# Patient Record
Sex: Male | Born: 1960
Health system: Southern US, Community
[De-identification: ages and names within clinical notes are randomized; demographics above are authoritative.]

## PROBLEM LIST (undated history)

## (undated) DIAGNOSIS — J309 Allergic rhinitis, unspecified: Secondary | ICD-10-CM

## (undated) DIAGNOSIS — J45909 Unspecified asthma, uncomplicated: Secondary | ICD-10-CM

## (undated) DIAGNOSIS — K219 Gastro-esophageal reflux disease without esophagitis: Secondary | ICD-10-CM

## (undated) DIAGNOSIS — F4310 Post-traumatic stress disorder, unspecified: Secondary | ICD-10-CM

## (undated) HISTORY — DX: Allergic rhinitis, unspecified: J30.9

## (undated) HISTORY — PX: COLONOSCOPY: SHX174

---

## 2003-01-23 ENCOUNTER — Ambulatory Visit (HOSPITAL_COMMUNITY): Admission: RE | Admit: 2003-01-23 | Discharge: 2003-01-23 | Payer: Self-pay | Admitting: Internal Medicine

## 2003-01-24 ENCOUNTER — Ambulatory Visit (HOSPITAL_COMMUNITY): Admission: RE | Admit: 2003-01-24 | Discharge: 2003-01-24 | Payer: Self-pay | Admitting: Internal Medicine

## 2010-01-16 ENCOUNTER — Emergency Department (HOSPITAL_COMMUNITY): Admission: EM | Admit: 2010-01-16 | Discharge: 2010-01-16 | Payer: Self-pay | Admitting: Emergency Medicine

## 2011-06-26 ENCOUNTER — Ambulatory Visit (INDEPENDENT_AMBULATORY_CARE_PROVIDER_SITE_OTHER): Payer: Federal, State, Local not specified - PPO | Admitting: Physician Assistant

## 2011-06-26 VITALS — BP 131/89 | HR 64 | Temp 97.9°F | Resp 16 | Ht 67.0 in | Wt 222.6 lb

## 2011-06-26 DIAGNOSIS — H9209 Otalgia, unspecified ear: Secondary | ICD-10-CM

## 2011-06-26 DIAGNOSIS — H6690 Otitis media, unspecified, unspecified ear: Secondary | ICD-10-CM

## 2011-06-26 MED ORDER — AMOXICILLIN-POT CLAVULANATE 875-125 MG PO TABS: 1.0000 | ORAL_TABLET | Freq: Two times a day (BID) | ORAL | Status: AC

## 2011-06-26 NOTE — Progress Notes (Signed)
Patient ID: Harry Simmons MRN: [id number], DOB: 10-Sep-1960, 51 y.o. Date of Encounter: 06/26/2011, 7:50 PM  Primary Physician: No primary provider on file.  Chief Complaint:  Chief Complaint  Patient presents with  . Otalgia    Right x 3 dys    HPI: 51 y.o. year old male presents with a 3 day history of right otalgia. Minimal congesiton. Afebrile. No cough. Ear painful and feels full, leading to sensation of muffled hearing. No drainage or discharge from affected ear. Has tried OTC cold preps without success. No GI complaints. Appetite normal. He does have a history of AOM of the right ear several times per year. This feels like his typical presentation. No dizziness or vertigo symptoms.  No sick contacts, recent antibiotics, or recent travels.    No past medical history on file.   Home Meds: Prior to Admission medications   Medication Sig Start Date End Date Taking? Authorizing Provider  albuterol (PROVENTIL HFA;VENTOLIN HFA) 108 (90 BASE) MCG/ACT inhaler Inhale 2 puffs into the lungs every 6 (six) hours as needed.   Yes Historical Provider, MD         budesonide-formoterol (SYMBICORT) 160-4.5 MCG/ACT inhaler Inhale 2 puffs into the lungs 2 (two) times daily.   Yes Historical Provider, MD  loratadine (CLARITIN) 10 MG tablet Take 10 mg by mouth daily.   Yes Historical Provider, MD  omeprazole (PRILOSEC) 20 MG capsule Take 20 mg by mouth daily.   Yes Historical Provider, MD    Allergies:  Allergies  Allergen Reactions  . Codeine Hives and Rash    History   Social History  . Marital Status: Single    Spouse Name: N/A    Number of Children: N/A  . Years of Education: N/A   Occupational History  . Not on file.   Social History Main Topics  . Smoking status: Never Smoker   . Smokeless tobacco: Not on file  . Alcohol Use: Yes     occasionally  . Drug Use: No  . Sexually Active: Yes   Other Topics Concern  . Not on file   Social History Narrative  . No  narrative on file     Review of Systems: Constitutional: negative for chills, fever, night sweats or weight changes HEENT: see above Respiratory: negative for hemoptysis, wheezing, or shortness of breath Abdominal: negative for abdominal pain, nausea, vomiting or diarrhea Dermatological: negative for rash Neurologic: negative for headache   Physical Exam: Blood pressure 131/89, pulse 64, temperature 97.9 F (36.6 C), temperature source Oral, resp. rate 16, height 5\' 7"  (1.702 m), weight 222 lb 9.6 oz (100.971 kg)., Body mass index is 34.86 kg/(m^2). General: Well developed, well nourished, in no acute distress. Head: Normocephalic, atraumatic, eyes without discharge, sclera non-icteric, nares are congested. Bilateral auditory canals clear. Right TM erythematous, dull, and bulging with purulent effusion behind. No perforation visualized. Contralateral TM pearly grey with reflective cone of light, no effusion or perforation visualized. Oral cavity moist, dentition normal. Posterior pharynx with post nasal drip and mild erythema. No peritonsillar abscess or tonsillar exudate. Neck: Supple. No thyromegaly. Full ROM. No lymphadenopathy. Lungs: Clear bilaterally to auscultation without wheezes, rales, or rhonchi. Breathing is unlabored.  Heart: RRR with S1 S2. No murmurs, rubs, or gallops appreciated. Msk:  Strength and tone normal for age. Extremities: No clubbing or cyanosis. No edema. Neuro: Alert and oriented X 3. Moves all extremities spontaneously. CNII-XII grossly in tact. Psych:  Responds to questions appropriately with a normal  affect.     ASSESSMENT AND PLAN:  51 y.o. year old male with acute otitis media of right ear without perforation. -Augmentin 875/125 mg #20 1 po bid no RF -Mucinex  -Tylenol prn -Rest/fluids -RTC precautions -RTC 3 days if no improvement  Signed, Eula Listen, PA-C 06/26/2011 7:50 PM

## 2011-11-17 ENCOUNTER — Emergency Department (HOSPITAL_COMMUNITY)
Admission: EM | Admit: 2011-11-17 | Discharge: 2011-11-17 | Disposition: A | Discharge status: Home / Self Care | Payer: Federal, State, Local not specified - PPO | Attending: Emergency Medicine | Admitting: Emergency Medicine

## 2011-11-17 ENCOUNTER — Emergency Department (HOSPITAL_COMMUNITY): Payer: Federal, State, Local not specified - PPO

## 2011-11-17 ENCOUNTER — Encounter (HOSPITAL_COMMUNITY): Payer: Self-pay | Admitting: Family Medicine

## 2011-11-17 DIAGNOSIS — Z885 Allergy status to narcotic agent status: Secondary | ICD-10-CM | POA: Insufficient documentation

## 2011-11-17 DIAGNOSIS — M549 Dorsalgia, unspecified: Secondary | ICD-10-CM | POA: Insufficient documentation

## 2011-11-17 DIAGNOSIS — K625 Hemorrhage of anus and rectum: Secondary | ICD-10-CM | POA: Insufficient documentation

## 2011-11-17 DIAGNOSIS — F4310 Post-traumatic stress disorder, unspecified: Secondary | ICD-10-CM | POA: Insufficient documentation

## 2011-11-17 DIAGNOSIS — R109 Unspecified abdominal pain: Secondary | ICD-10-CM | POA: Insufficient documentation

## 2011-11-17 DIAGNOSIS — J45909 Unspecified asthma, uncomplicated: Secondary | ICD-10-CM | POA: Insufficient documentation

## 2011-11-17 DIAGNOSIS — Z91013 Allergy to seafood: Secondary | ICD-10-CM | POA: Insufficient documentation

## 2011-11-17 DIAGNOSIS — G8929 Other chronic pain: Secondary | ICD-10-CM | POA: Insufficient documentation

## 2011-11-17 HISTORY — DX: Post-traumatic stress disorder, unspecified: F43.10

## 2011-11-17 HISTORY — DX: Unspecified asthma, uncomplicated: J45.909

## 2011-11-17 LAB — CBC WITH DIFFERENTIAL/PLATELET
Basophils Absolute: 0 10*3/uL (ref 0.0–0.1)
Basophils Relative: 0 % (ref 0–1)
Eosinophils Absolute: 0.1 10*3/uL (ref 0.0–0.7)
Eosinophils Relative: 3 % (ref 0–5)
MCH: 27.2 pg (ref 26.0–34.0)
MCV: 81.7 fL (ref 78.0–100.0)
Platelets: 258 10*3/uL (ref 150–400)
RDW: 14.7 % (ref 11.5–15.5)

## 2011-11-17 LAB — COMPREHENSIVE METABOLIC PANEL
ALT: 25 U/L (ref 0–53)
AST: 15 U/L (ref 0–37)
Calcium: 9.6 mg/dL (ref 8.4–10.5)
GFR calc Af Amer: >90 mL/min (ref >90)
Sodium: 139 mEq/L (ref 135–145)
Total Protein: 7.6 g/dL (ref 6.0–8.3)

## 2011-11-17 LAB — TYPE AND SCREEN

## 2011-11-17 LAB — ABO/RH: ABO/RH(D): B POS

## 2011-11-17 NOTE — ED Notes (Signed)
Pt states his back pain is from his lower back injury previously and his abd pain may be from his acid reflux.

## 2011-11-17 NOTE — ED Notes (Signed)
Per pt 3 weeks of bright red bleeding  From his rectum. sts some lower abdominal pain. sts happens about 3 days a week.

## 2011-11-17 NOTE — ED Notes (Signed)
Pt states that he has had 2 colonoscopies and 1 endoscopy within the last 5 years at the [place] with no abnormal results.

## 2011-11-17 NOTE — ED Provider Notes (Signed)
History     CSN: [id number]  Arrival date & time 11/17/11  1320   First MD Initiated Contact with Patient 11/17/11 1731      Chief Complaint  Patient presents with  . Rectal Bleeding    (Consider location/radiation/quality/duration/timing/severity/associated sxs/prior treatment) The history is provided by the patient.   patient is a 51 year old male veteran followed at the [place] in [facility]. Presents today with three-week history of of rectal bleeding on and off he is actually had this for months to years and has had several extensive workups at the [place] to include at least 3 colonoscopies without finding any etiology some question that there are hemorrhoids that may be related to that but they have improved. Also associated with lower abdominal pain is also chronic. Patient states that the rectal bleeding for a little increased over the last few days and that's why he's concerned that maybe his blood counts would be low. Abdominal pain is in the lower part of his abdomen it's about 5/10 that's typical for him nothing new or worse about that. The bowel movements with blood was only one today and he had 2 yesterday they do stay in the tall order read as also had some bowel movements without any blood. Patient also has a history of chronic back pain known to have a lumbar pars defect he would like to have that read x-ray because that's been getting worse for the last few days as well. No entry. The back pain is also described as a 2-3/10 nonradiating is in the lumbar area. No neuro deficits distally to his lower extremities.  Past Medical History  Diagnosis Date  . Asthma   . PTSD (post-traumatic stress disorder)     History reviewed. No pertinent past surgical history.  History reviewed. No pertinent family history.  History  Substance Use Topics  . Smoking status: Never Smoker   . Smokeless tobacco: Not on file  . Alcohol Use: Yes     occasionally      Review of  Systems  Constitutional: Negative for fever.  HENT: Negative for neck pain.   Respiratory: Negative for shortness of breath.   Cardiovascular: Negative for chest pain.  Gastrointestinal: Positive for abdominal pain and blood in stool. Negative for nausea, vomiting and diarrhea.  Genitourinary: Negative for dysuria.  Musculoskeletal: Positive for back pain.  Skin: Negative for rash.  Neurological: Negative for dizziness, weakness, light-headedness and headaches.  Hematological: Does not bruise/bleed easily.    Allergies  Codeine and Lobster  Home Medications   Current Outpatient Rx  Name Route Sig Dispense Refill  . ALBUTEROL SULFATE HFA 108 (90 BASE) MCG/ACT IN AERS Inhalation Inhale 2 puffs into the lungs every 6 (six) hours as needed.    . BUDESONIDE-FORMOTEROL FUMARATE 160-4.5 MCG/ACT IN AERO Inhalation Inhale 2 puffs into the lungs 2 (two) times daily.    Marland Kitchen LORATADINE 10 MG PO TABS Oral Take 10 mg by mouth daily.    Marland Kitchen MIRTAZAPINE 15 MG PO TABS Oral Take 15 mg by mouth at bedtime.    . OMEPRAZOLE 20 MG PO CPDR Oral Take 20 mg by mouth daily.    . SERTRALINE HCL 100 MG PO TABS Oral Take 100 mg by mouth daily.    . TRAMADOL HCL 50 MG PO TABS Oral Take 50 mg by mouth every 6 (six) hours as needed.      BP 128/43  Pulse 50  Temp 98.2 F (36.8 C) (Oral)  Resp 16  SpO2 99%  Physical Exam  Nursing note and vitals reviewed. Constitutional: He is oriented to person, place, and time. He appears well-developed and well-nourished. No distress.  HENT:  Head: Normocephalic and atraumatic.  Mouth/Throat: Oropharynx is clear and moist.  Eyes: Conjunctivae and EOM are normal. Pupils are equal, round, and reactive to light.  Neck: Normal range of motion. Neck supple.  Cardiovascular: Normal rate, regular rhythm and normal heart sounds.   No murmur heard. Pulmonary/Chest: Effort normal and breath sounds normal.  Abdominal: Soft. Bowel sounds are normal. There is no tenderness.    Musculoskeletal: Normal range of motion. He exhibits no edema and no tenderness.  Neurological: He is alert and oriented to person, place, and time. No cranial nerve deficit. He exhibits normal muscle tone. Coordination normal.  Skin: Skin is warm. No rash noted.    ED Course  Procedures (including critical care time)  Labs Reviewed  CBC WITH DIFFERENTIAL - Abnormal; Notable for the following:    Neutrophils Relative 41 (*)     Lymphocytes Relative 50 (*)     All other components within normal limits  COMPREHENSIVE METABOLIC PANEL - Abnormal; Notable for the following:    Glucose, Bld 124 (*)     GFR calc non Af Amer 85 (*)     All other components within normal limits  TYPE AND SCREEN  ABO/RH   Dg Lumbar Spine Complete  11/17/2011  *RADIOLOGY REPORT*  Clinical Data: Low back pain, no injury  LUMBAR SPINE - COMPLETE 4+ VIEW  Comparison: None.  Findings: The lumbar vertebrae are straightened in alignment. Intervertebral disc spaces appear normal.  There are bilateral pars defects at L5 but no anterolisthesis is seen.  The SI joints appear corticated.  IMPRESSION: Straightened alignment.  Bilateral pars defects at L5.   Original Report Authenticated By: Juline Patch, M.D.    Results for orders placed during the hospital encounter of 11/17/11  CBC WITH DIFFERENTIAL      Component Value Range   WBC 4.5  4.0 - 10.5 K/uL   RBC 5.15  4.22 - 5.81 MIL/uL   Hemoglobin 14.0  13.0 - 17.0 g/dL   HCT 16.1  09.6 - 04.5 %   MCV 81.7  78.0 - 100.0 fL   MCH 27.2  26.0 - 34.0 pg   MCHC 33.3  30.0 - 36.0 g/dL   RDW 40.9  81.1 - 91.4 %   Platelets 258  150 - 400 K/uL   Neutrophils Relative 41 (*) 43 - 77 %   Neutro Abs 1.8  1.7 - 7.7 K/uL   Lymphocytes Relative 50 (*) 12 - 46 %   Lymphs Abs 2.2  0.7 - 4.0 K/uL   Monocytes Relative 6  3 - 12 %   Monocytes Absolute 0.3  0.1 - 1.0 K/uL   Eosinophils Relative 3  0 - 5 %   Eosinophils Absolute 0.1  0.0 - 0.7 K/uL   Basophils Relative 0  0 - 1 %    Basophils Absolute 0.0  0.0 - 0.1 K/uL  COMPREHENSIVE METABOLIC PANEL      Component Value Range   Sodium 139  135 - 145 mEq/L   Potassium 3.8  3.5 - 5.1 mEq/L   Chloride 103  96 - 112 mEq/L   CO2 27  19 - 32 mEq/L   Glucose, Bld 124 (*) 70 - 99 mg/dL   BUN 14  6 - 23 mg/dL   Creatinine, Ser 7.82  0.50 -  1.35 mg/dL   Calcium 9.6  8.4 - 40.9 mg/dL   Total Protein 7.6  6.0 - 8.3 g/dL   Albumin 3.6  3.5 - 5.2 g/dL   AST 15  0 - 37 U/L   ALT 25  0 - 53 U/L   Alkaline Phosphatase 91  39 - 117 U/L   Total Bilirubin 0.3  0.3 - 1.2 mg/dL   GFR calc non Af Amer 85 (*) >90 mL/min   GFR calc Af Amer >90  >90 mL/min  TYPE AND SCREEN      Component Value Range   ABO/RH(D) B POS     Antibody Screen NEG     Sample Expiration 11/20/2011    ABO/RH      Component Value Range   ABO/RH(D) B POS       1. Rectal bleeding   2. Chronic abdominal pain   3. Chronic back pain       MDM  Patient with history of rectal bleeding on and off for several months his had multiple colonoscopies trying to figure this out. Etiology is not known. Here of late patient's had some increased bleeding. Vital signs are normal hemoglobin and hematocrit are normal no significant evidence of blood loss. Patient also has a history of chronic abdominal pain and chronic back pain x-rays at the back today without any acute changes he does have a pars defect according to the patient has been present in the past. Patient is normally followed by the [place] in [facility].  Patient is stable to be discharged home. He will followup with the [place]. He also has a option of returning here.        Shelda Jakes, MD 11/17/11 2132

## 2012-05-16 ENCOUNTER — Encounter (HOSPITAL_COMMUNITY): Payer: Self-pay | Admitting: Emergency Medicine

## 2012-05-16 ENCOUNTER — Inpatient Hospital Stay (HOSPITAL_COMMUNITY)
Admission: EM | Admit: 2012-05-16 | Discharge: 2012-05-21 | DRG: 494 | Disposition: A | Discharge status: Home / Self Care | Payer: Federal, State, Local not specified - PPO | Attending: Internal Medicine | Admitting: Internal Medicine

## 2012-05-16 DIAGNOSIS — R1013 Epigastric pain: Secondary | ICD-10-CM

## 2012-05-16 DIAGNOSIS — K8062 Calculus of gallbladder and bile duct with acute cholecystitis without obstruction: Principal | ICD-10-CM | POA: Diagnosis present

## 2012-05-16 DIAGNOSIS — J45909 Unspecified asthma, uncomplicated: Secondary | ICD-10-CM | POA: Diagnosis present

## 2012-05-16 DIAGNOSIS — K8042 Calculus of bile duct with acute cholecystitis without obstruction: Secondary | ICD-10-CM | POA: Diagnosis present

## 2012-05-16 DIAGNOSIS — R7989 Other specified abnormal findings of blood chemistry: Secondary | ICD-10-CM | POA: Diagnosis present

## 2012-05-16 DIAGNOSIS — K219 Gastro-esophageal reflux disease without esophagitis: Secondary | ICD-10-CM | POA: Diagnosis present

## 2012-05-16 DIAGNOSIS — K8050 Calculus of bile duct without cholangitis or cholecystitis without obstruction: Secondary | ICD-10-CM

## 2012-05-16 DIAGNOSIS — R945 Abnormal results of liver function studies: Secondary | ICD-10-CM | POA: Diagnosis present

## 2012-05-16 HISTORY — DX: Gastro-esophageal reflux disease without esophagitis: K21.9

## 2012-05-16 MED ORDER — HYDROMORPHONE HCL PF 1 MG/ML IJ SOLN
1.0000 mg | Freq: Once | INTRAMUSCULAR | Status: AC
  Administered 2012-05-16: 1 mg via INTRAVENOUS
  Filled 2012-05-16: qty 1

## 2012-05-16 MED ORDER — SODIUM CHLORIDE 0.9 % IV BOLUS (SEPSIS)
1000.0000 mL | Freq: Once | INTRAVENOUS | Status: AC
  Administered 2012-05-17: 1000 mL via INTRAVENOUS

## 2012-05-16 MED ORDER — ONDANSETRON HCL 4 MG/2ML IJ SOLN
4.0000 mg | Freq: Once | INTRAMUSCULAR | Status: AC
  Administered 2012-05-16: 4 mg via INTRAVENOUS
  Filled 2012-05-16: qty 2

## 2012-05-16 NOTE — ED Provider Notes (Signed)
History     CSN: [id number]  Arrival date & time 05/16/12  1901   First MD Initiated Contact with Patient 05/16/12 2146      Chief Complaint  Patient presents with  . Abdominal Pain    (Consider location/radiation/quality/duration/timing/severity/associated sxs/prior treatment) HPI Comments: Patient is a 52 year old male with no significant past medical history who presents with abdominal pain that started last night. The pain is located in his generalized abdomen and does not radiate. The pain is described as aching and severe. The pain started gradually and progressively worsened since the onset. No alleviating/aggravating factors. The patient has tried nothing for symptoms without relief. Associated symptoms include nausea and vomiting. Patient denies fever, headache, diarrhea, chest pain, SOB, dysuria, constipation.     Past Medical History  Diagnosis Date  . Asthma   . PTSD (post-traumatic stress disorder)     Past Surgical History  Procedure Laterality Date  . Colonoscopy      Family History  Problem Relation Age of Onset  . Diabetes Mother   . Hypotension Mother   . Hypertension Father   . CAD Father   . Diabetes Sister   . CAD Sister   . Hypertension Sister   . Diabetes Brother     History  Substance Use Topics  . Smoking status: Never Smoker   . Smokeless tobacco: Not on file  . Alcohol Use: No      Review of Systems  Gastrointestinal: Positive for nausea, vomiting and abdominal pain.  All other systems reviewed and are negative.    Allergies  Codeine and Lobster  Home Medications   Current Outpatient Rx  Name  Route  Sig  Dispense  Refill  . budesonide-formoterol (SYMBICORT) 160-4.5 MCG/ACT inhaler   Inhalation   Inhale 2 puffs into the lungs 2 (two) times daily.         . mirtazapine (REMERON) 15 MG tablet   Oral   Take 15 mg by mouth at bedtime as needed. For sleep         . omeprazole (PRILOSEC) 20 MG capsule   Oral   Take 20  mg by mouth daily.         . sertraline (ZOLOFT) 100 MG tablet   Oral   Take 100 mg by mouth daily.         . traMADol (ULTRAM) 50 MG tablet   Oral   Take 50 mg by mouth every 6 (six) hours as needed for pain.          Marland Kitchen albuterol (PROVENTIL HFA;VENTOLIN HFA) 108 (90 BASE) MCG/ACT inhaler   Inhalation   Inhale 2 puffs into the lungs every 6 (six) hours as needed.         . loratadine (CLARITIN) 10 MG tablet   Oral   Take 10 mg by mouth daily as needed. For allergies           BP 135/90  Pulse 65  Temp(Src) 98.1 F (36.7 C) (Oral)  Resp 16  Ht 5\' 7"  (1.702 m)  Wt 215 lb (97.523 kg)  BMI 33.67 kg/m2  SpO2 97%  Physical Exam  Nursing note and vitals reviewed. Constitutional: He is oriented to person, place, and time. He appears well-developed and well-nourished. No distress.  HENT:  Head: Normocephalic and atraumatic.  Eyes: Conjunctivae and EOM are normal. Pupils are equal, round, and reactive to light. No scleral icterus.  Neck: Normal range of motion. Neck supple.  Cardiovascular: Normal rate  and regular rhythm.  Exam reveals no gallop and no friction rub.   No murmur heard. Pulmonary/Chest: Effort normal and breath sounds normal. He has no wheezes. He has no rales. He exhibits no tenderness.  Abdominal: Soft. He exhibits no distension. There is tenderness. There is no rebound and no guarding.  Generalized tenderness to palpation. No peritoneal signs.   Musculoskeletal: Normal range of motion.  Neurological: He is alert and oriented to person, place, and time. Coordination normal.  Speech is goal-oriented. Moves limbs without ataxia.   Skin: Skin is warm and dry.  Psychiatric: He has a normal mood and affect. His behavior is normal.    ED Course  Procedures (including critical care time)  Labs Reviewed  COMPREHENSIVE METABOLIC PANEL - Abnormal; Notable for the following:    Glucose, Bld 109 (*)    Total Protein 8.6 (*)    AST 487 (*)    ALT 833 (*)     Alkaline Phosphatase 160 (*)    Total Bilirubin 1.9 (*)    All other components within normal limits  URINALYSIS, ROUTINE W REFLEX MICROSCOPIC - Abnormal; Notable for the following:    Color, Urine AMBER (*)    Specific Gravity, Urine 1.031 (*)    Bilirubin Urine MODERATE (*)    Urobilinogen, UA 2.0 (*)    Leukocytes, UA TRACE (*)    All other components within normal limits  URINE MICROSCOPIC-ADD ON - Abnormal; Notable for the following:    Crystals CA OXALATE CRYSTALS (*)    All other components within normal limits  COMPREHENSIVE METABOLIC PANEL - Abnormal; Notable for the following:    Glucose, Bld 105 (*)    Albumin 3.3 (*)    AST 237 (*)    ALT 608 (*)    Alkaline Phosphatase 165 (*)    Total Bilirubin 3.2 (*)    All other components within normal limits  COMPREHENSIVE METABOLIC PANEL - Abnormal; Notable for the following:    Albumin 3.2 (*)    AST 128 (*)    ALT 455 (*)    Alkaline Phosphatase 172 (*)    Total Bilirubin 4.0 (*)    GFR calc non Af Amer 86 (*)    All other components within normal limits  COMPREHENSIVE METABOLIC PANEL - Abnormal; Notable for the following:    Albumin 3.0 (*)    AST 58 (*)    ALT 305 (*)    Alkaline Phosphatase 166 (*)    Total Bilirubin 1.4 (*)    GFR calc non Af Amer 85 (*)    All other components within normal limits  CBC WITH DIFFERENTIAL  LIPASE, BLOOD  MAGNESIUM  PHOSPHORUS  CBC WITH DIFFERENTIAL  APTT  PROTIME-INR  TSH  CBC   Nm Hepatobiliary  05/17/2012  *RADIOLOGY REPORT*  Clinical Data:  Elevated bilirubin.  NUCLEAR MEDICINE HEPATOBILIARY IMAGING  Technique:  Sequential images of the abdomen were obtained out to 60 minutes following intravenous administration of radiopharmaceutical.  Radiopharmaceutical:  5.67mCi Tc-20m Choletec  Comparison:  I have CT 05/17/2012,  Findings: There is prompt extraction of radiotracer from the blood pool and homogeneous uptake within the liver.  There are no counts excreted into the  common bile duct over the 95 minutes of imaging. No activity within the bowel.  The gallbladder is not filled.  The biliary tree is not identified readily.  IMPRESSION:  1.  No counts excreted into the biliary tree.  Legrand Rams this to represent  obstruction of the  common bile duct or common hepatic duct.  Less likely differential would include acute hepatitis. 2.  Nonvisualization the gallbladder is likely secondary to obstruction of the common hepatic or  common bile duct.  Cannot evaluate patency of the cystic duct with this pattern.  Findings discussed Dr. Debroah Loop  on 05/18/2038 at 1600 hours   Original Report Authenticated By: Genevive Bi, M.D.    Dg C-arm 61-120 Min-no Report  05/18/2012  CLINICAL DATA: CBD Stones   C-ARM 61-120 MINUTES  Fluoroscopy was utilized by the requesting physician.  No radiographic  interpretation.       1. Choledocholithiasis   2. Abdominal pain   3. Cholelithiases   4. Abnormal liver function tests   5. Abdominal pain, epigastric       MDM  10:26 PM Labs pending. Patient will have fluids, zofran and morphine for symptoms. Vitals stable and patient afebrile.   Patient has elevated liver enzymes and dilated CBD. Patient signed out to Dr. Rulon Abide for disposition.       Emilia Beck, PA-C 05/19/12 [phone]

## 2012-05-16 NOTE — ED Notes (Signed)
Pt states he has pain in his abdomen that started last night in his lower abdomen and have worked upward  Pt states the pain made it hard to breathe and then he started to have vomiting  Pt states he went to the [place] and waited for 3 hrs and had not been seen so he came here  Pt is in no acute distress in triage  Pt states vomiting has not helped with the pain

## 2012-05-17 ENCOUNTER — Encounter (HOSPITAL_COMMUNITY): Payer: Self-pay | Admitting: Internal Medicine

## 2012-05-17 ENCOUNTER — Inpatient Hospital Stay (HOSPITAL_COMMUNITY): Payer: Federal, State, Local not specified - PPO

## 2012-05-17 ENCOUNTER — Emergency Department (HOSPITAL_COMMUNITY): Payer: Federal, State, Local not specified - PPO

## 2012-05-17 DIAGNOSIS — K8010 Calculus of gallbladder with chronic cholecystitis without obstruction: Secondary | ICD-10-CM

## 2012-05-17 DIAGNOSIS — K824 Cholesterolosis of gallbladder: Secondary | ICD-10-CM

## 2012-05-17 LAB — CBC WITH DIFFERENTIAL/PLATELET
Basophils Absolute: 0 10*3/uL (ref 0.0–0.1)
Basophils Relative: 0 % (ref 0–1)
Basophils Relative: 0 % (ref 0–1)
Eosinophils Absolute: 0.2 10*3/uL (ref 0.0–0.7)
Hemoglobin: 15.1 g/dL (ref 13.0–17.0)
Hemoglobin: 13.5 g/dL (ref 13.0–17.0)
MCH: 27.1 pg (ref 26.0–34.0)
MCHC: 33.6 g/dL (ref 30.0–36.0)
MCHC: 32.9 g/dL (ref 30.0–36.0)
Monocytes Relative: 7 % (ref 3–12)
Neutro Abs: 3.6 10*3/uL (ref 1.7–7.7)
Neutro Abs: 2 10*3/uL (ref 1.7–7.7)
Neutrophils Relative %: 64 % (ref 43–77)
Neutrophils Relative %: 45 % (ref 43–77)
Platelets: 251 10*3/uL (ref 150–400)
RBC: 4.98 MIL/uL (ref 4.22–5.81)

## 2012-05-17 LAB — COMPREHENSIVE METABOLIC PANEL
ALT: 833 U/L — ABNORMAL HIGH (ref 0–53)
ALT: 608 U/L — ABNORMAL HIGH (ref 0–53)
AST: 487 U/L — ABNORMAL HIGH (ref 0–37)
AST: 237 U/L — ABNORMAL HIGH (ref 0–37)
Albumin: 3.9 g/dL (ref 3.5–5.2)
Albumin: 3.3 g/dL — ABNORMAL LOW (ref 3.5–5.2)
Alkaline Phosphatase: 160 U/L — ABNORMAL HIGH (ref 39–117)
Alkaline Phosphatase: 165 U/L — ABNORMAL HIGH (ref 39–117)
BUN: 14 mg/dL (ref 6–23)
CO2: 26 mEq/L (ref 19–32)
Chloride: 102 mEq/L (ref 96–112)
Chloride: 107 mEq/L (ref 96–112)
GFR calc non Af Amer: >90 mL/min (ref >90)
Potassium: 4.1 mEq/L (ref 3.5–5.1)
Potassium: 4.8 mEq/L (ref 3.5–5.1)
Sodium: 140 mEq/L (ref 135–145)
Sodium: 142 mEq/L (ref 135–145)
Total Bilirubin: 1.9 mg/dL — ABNORMAL HIGH (ref 0.3–1.2)
Total Bilirubin: 3.2 mg/dL — ABNORMAL HIGH (ref 0.3–1.2)

## 2012-05-17 LAB — URINALYSIS, ROUTINE W REFLEX MICROSCOPIC
Glucose, UA: NEGATIVE mg/dL
Hgb urine dipstick: NEGATIVE
Ketones, ur: NEGATIVE mg/dL
pH: 6.5 (ref 5.0–8.0)

## 2012-05-17 LAB — URINE MICROSCOPIC-ADD ON

## 2012-05-17 LAB — APTT: aPTT: 30 seconds (ref 24–37)

## 2012-05-17 LAB — PROTIME-INR
INR: 1.09 (ref 0.00–1.49)
Prothrombin Time: 14 seconds (ref 11.6–15.2)

## 2012-05-17 LAB — PHOSPHORUS: Phosphorus: 2.4 mg/dL (ref 2.3–4.6)

## 2012-05-17 LAB — MAGNESIUM: Magnesium: 2.1 mg/dL (ref 1.5–2.5)

## 2012-05-17 MED ORDER — ONDANSETRON HCL 4 MG/2ML IJ SOLN
4.0000 mg | Freq: Four times a day (QID) | INTRAMUSCULAR | Status: DC | PRN
  Administered 2012-05-17 – 2012-05-19 (×3): 4 mg via INTRAVENOUS
  Filled 2012-05-17 (×3): qty 2

## 2012-05-17 MED ORDER — LACTATED RINGERS IV SOLN: INTRAVENOUS | Status: AC

## 2012-05-17 MED ORDER — MIRTAZAPINE 15 MG PO TABS
15.0000 mg | ORAL_TABLET | Freq: Every evening | ORAL | Status: DC | PRN
  Filled 2012-05-17: qty 1

## 2012-05-17 MED ORDER — LORATADINE 10 MG PO TABS: 10.0000 mg | ORAL_TABLET | Freq: Every day | ORAL | Status: DC | PRN

## 2012-05-17 MED ORDER — BUDESONIDE-FORMOTEROL FUMARATE 160-4.5 MCG/ACT IN AERO
2.0000 | INHALATION_SPRAY | Freq: Two times a day (BID) | RESPIRATORY_TRACT | Status: DC
  Administered 2012-05-17 – 2012-05-21 (×9): 2 via RESPIRATORY_TRACT
  Filled 2012-05-17: qty 6

## 2012-05-17 MED ORDER — IOHEXOL 300 MG/ML  SOLN
100.0000 mL | Freq: Once | INTRAMUSCULAR | Status: AC | PRN
  Administered 2012-05-17: 100 mL via INTRAVENOUS

## 2012-05-17 MED ORDER — IOHEXOL 300 MG/ML  SOLN
50.0000 mL | Freq: Once | INTRAMUSCULAR | Status: AC | PRN
  Administered 2012-05-17: 50 mL via ORAL

## 2012-05-17 MED ORDER — HYDROMORPHONE HCL PF 1 MG/ML IJ SOLN
1.0000 mg | INTRAMUSCULAR | Status: AC | PRN
  Administered 2012-05-17: 1 mg via INTRAVENOUS
  Filled 2012-05-17: qty 1

## 2012-05-17 MED ORDER — ONDANSETRON HCL 4 MG PO TABS: 4.0000 mg | ORAL_TABLET | Freq: Four times a day (QID) | ORAL | Status: DC | PRN

## 2012-05-17 MED ORDER — PANTOPRAZOLE SODIUM 40 MG PO TBEC
40.0000 mg | DELAYED_RELEASE_TABLET | Freq: Every day | ORAL | Status: DC
  Administered 2012-05-21: 40 mg via ORAL
  Filled 2012-05-17 (×5): qty 1

## 2012-05-17 MED ORDER — ONDANSETRON HCL 4 MG/2ML IJ SOLN: 4.0000 mg | Freq: Three times a day (TID) | INTRAMUSCULAR | Status: DC | PRN

## 2012-05-17 MED ORDER — SERTRALINE HCL 100 MG PO TABS
100.0000 mg | ORAL_TABLET | Freq: Every day | ORAL | Status: DC
  Filled 2012-05-17 (×5): qty 1

## 2012-05-17 MED ORDER — SODIUM CHLORIDE 0.9 % IV SOLN
INTRAVENOUS | Status: DC
  Administered 2012-05-17 – 2012-05-21 (×8): via INTRAVENOUS

## 2012-05-17 MED ORDER — ALBUTEROL SULFATE HFA 108 (90 BASE) MCG/ACT IN AERS
2.0000 | INHALATION_SPRAY | Freq: Four times a day (QID) | RESPIRATORY_TRACT | Status: DC | PRN
  Filled 2012-05-17: qty 6.7

## 2012-05-17 MED ORDER — TECHNETIUM TC 99M MEBROFENIN IV KIT
5.5000 | PACK | Freq: Once | INTRAVENOUS | Status: AC | PRN
  Administered 2012-05-17: 5.5 via INTRAVENOUS

## 2012-05-17 NOTE — H&P (Signed)
[facility] History and Physical  GURTEJ NOYOLA ZOX:[id number] DOB: Oct 28, 1960 DOA: 05/16/2012  Referring physician: ER physician PCP: Aarron Wierzbicki, MD   Chief Complaint: Epigastric pain  HPI:  52 year old pleasant gentleman with past medical history of asthma and depression who presented with worsening epigastric pain for one day prior to the admission associated with nausea and one episode of vomiting. Patient reported no significant symptomatic relief with pain medications given in ED. In ED, evaluation included CMET which revealed elevated liver enzymes at AST/ALT/ALT 237/608/165 respectively and bilirubin of 3.2. In addition, abdominal ultrasound showed findings of cholelithiasis without sonographic evidence of acute cholecystitis. GI was consulted for input on management. Plan for HIDA scan today and EUS with possible ERCP.  Assessment and Plan:  Principal Problem:   *Abdominal pain, epigastric  Secondary to cholelithiasis  Liver enzymes elevated as mentioned above: AST/ALT/ALT 237/608/165 respectively and bilirubin of 3.2  Appreciate GI following. Plan for HIDA scan today and EUS +/- ERCP Active Problems:   Abnormal liver function tests  Secondary to cholelithiasis  Trend LFTs  Code Status: Full Family Communication: Pt at bedside Disposition Plan: Admit for further evaluation  Manson Passey, MD  [facility] Pager [phone]  If 7PM-7AM, please contact night-coverage [email or web address] Password [email or web address] 05/17/2012, 3:13 PM  Review of Systems:  Constitutional: Negative for fever, chills and malaise/fatigue. Negative for diaphoresis.  HENT: Negative for hearing loss, ear pain, nosebleeds, congestion, sore throat, neck pain, tinnitus and ear discharge.   Eyes: Negative for blurred vision, double vision, photophobia, pain, discharge and redness.  Respiratory: Negative for cough, hemoptysis, sputum production, shortness of breath, wheezing and stridor.   Cardiovascular:  Negative for chest pain, palpitations, orthopnea, claudication and leg swelling.  Gastrointestinal: per HPI.  Genitourinary: Negative for dysuria, urgency, frequency, hematuria and flank pain.  Musculoskeletal: Negative for myalgias, back pain, joint pain and falls.  Skin: Negative for itching and rash.  Neurological: Negative for dizziness and weakness. Negative for tingling, tremors, sensory change, speech change, focal weakness, loss of consciousness and headaches.  Endo/Heme/Allergies: Negative for environmental allergies and polydipsia. Does not bruise/bleed easily.  Psychiatric/Behavioral: Negative for suicidal ideas. The patient is not nervous/anxious.      Past Medical History  Diagnosis Date  . Asthma   . PTSD (post-traumatic stress disorder)    Past Surgical History  Procedure Laterality Date  . Colonoscopy     Social History:  reports that he has never smoked. He has never used smokeless tobacco. He reports that he does not drink alcohol or use illicit drugs.  Allergies  Allergen Reactions  . Codeine Hives and Rash    Tolerates Dilaudid.  Chrisandra Netters (Shellfish Allergy) Shortness Of Breath    Patient stated not all shellfish just lobster    Family History:  Family History  Problem Relation Age of Onset  . Diabetes Mother   . Hypotension Mother   . Hypertension Father   . CAD Father   . Diabetes Sister   . CAD Sister   . Hypertension Sister   . Diabetes Brother      Prior to Admission medications   Medication Sig Start Date End Date Taking? Authorizing Jerrod Damiano  budesonide-formoterol (SYMBICORT) 160-4.5 MCG/ACT inhaler Inhale 2 puffs into the lungs 2 (two) times daily.   Yes Historical Cleofas Hudgins, MD  mirtazapine (REMERON) 15 MG tablet Take 15 mg by mouth at bedtime as needed. For sleep   Yes Historical Kierria Feigenbaum, MD  omeprazole (PRILOSEC) 20 MG capsule Take 20 mg by mouth daily.  Yes Historical Leeloo Silverthorne, MD  sertraline (ZOLOFT) 100 MG tablet Take 100 mg by mouth  daily.   Yes Historical Elois Averitt, MD  traMADol (ULTRAM) 50 MG tablet Take 50 mg by mouth every 6 (six) hours as needed for pain.    Yes Historical Torell Minder, MD  albuterol (PROVENTIL HFA;VENTOLIN HFA) 108 (90 BASE) MCG/ACT inhaler Inhale 2 puffs into the lungs every 6 (six) hours as needed.    Historical Melina Mosteller, MD  loratadine (CLARITIN) 10 MG tablet Take 10 mg by mouth daily as needed. For allergies    Historical Agam Tuohy, MD   Physical Exam: Filed Vitals:   05/16/12 2007 05/17/12 0326 05/17/12 0950  BP: 135/90 116/73 133/82  Pulse: 65 57 49  Temp: 98.1 F (36.7 C)  97.9 F (36.6 C)  TempSrc: Oral  Oral  Resp: 16 15 16   Height: 5\' 7"  (1.702 m)    Weight: 97.523 kg (215 lb)    SpO2: 97% 97% 96%    Physical Exam  Constitutional: Appears well-developed and well-nourished. No distress.  HENT: Normocephalic. External right and left ear normal. Oropharynx is clear and moist.  Eyes: Conjunctivae and EOM are normal. PERRLA, no scleral icterus.  Neck: Normal ROM. Neck supple. No JVD. No tracheal deviation. No thyromegaly.  CVS: RRR, S1/S2 +, no murmurs, no gallops, no carotid bruit.  Pulmonary: Effort and breath sounds normal, no stridor, rhonchi, wheezes, rales.  Abdominal: Soft. BS +,  no distension, tenderness, rebound or guarding.  Musculoskeletal: Normal range of motion. No edema and no tenderness.  Lymphadenopathy: No lymphadenopathy noted, cervical, inguinal. Neuro: Alert. Normal reflexes, muscle tone coordination. No cranial nerve deficit. Skin: Skin is warm and dry. No rash noted. Not diaphoretic. No erythema. No pallor.  Psychiatric: Normal mood and affect. Behavior, judgment, thought content normal.   Labs on Admission:  Basic Metabolic Panel:  Recent Labs Lab 05/16/12 2315 05/17/12 0945  NA 140 142  K 4.1 4.8  CL 102 107  CO2 27 26  GLUCOSE 109* 105*  BUN 14 10  CREATININE 0.95 0.93  CALCIUM 9.6 8.9  MG  --  2.1  PHOS  --  2.4   Liver Function Tests:  Recent  Labs Lab 05/16/12 2315 05/17/12 0945  AST 487* 237*  ALT 833* 608*  ALKPHOS 160* 165*  BILITOT 1.9* 3.2*  PROT 8.6* 7.4  ALBUMIN 3.9 3.3*    Recent Labs Lab 05/16/12 2315  LIPASE 34   No results found for this basename: AMMONIA,  in the last 168 hours CBC:  Recent Labs Lab 05/16/12 2315 05/17/12 0945  WBC 5.5 4.5  NEUTROABS 3.6 2.0  HGB 15.1 13.5  HCT 44.9 41.0  MCV 81.6 82.3  PLT 276 251   Cardiac Enzymes: No results found for this basename: CKTOTAL, CKMB, CKMBINDEX, TROPONINI,  in the last 168 hours BNP: No components found with this basename: POCBNP,  CBG: No results found for this basename: GLUCAP,  in the last 168 hours  Radiological Exams on Admission: US Abdomen Complete 05/17/2012  * IMPRESSION: Cholelithiasis without sonographic evidence for acute cholecystitis.  Nonspecific dilatationof of the proximal CBD. The distal CBD is obscured. Correlate with LFTs and ERCP if warranted.  Hepatic steatosis.  Hypoechoic area along the gallbladder fossa may reflect an area of fatty sparing.  This can be reevaluated with a 6- month ultrasound follow-up or non emergent MRI.      EKG: Normal sinus rhythm, no ST/T wave changes  Time spent: 65 minutes

## 2012-05-17 NOTE — Consult Note (Signed)
Reason for Consult: Cholelithiasis and abnormal liver enzymes. Referring Physician: [facility]  Darene Lamer HPI: This is a 52 year old male with a PMH of asthma and a vague history of "inflammation" in his intestines who is admitted for epigastric pain.  The patient reports that his pain was unresponsive to PPIs and it continued to persist.  He had one episode of vomiting yesterday and the states that his symptoms started acutely 2 hours post prandially.  Since his arrival to the hospital his pain has not changed.  An ultrasound was performed for his elevated liver enzymes and cholelithiasis was identified, however, there was no evidence of cholecystitis.  The follow up CT scan confirmed the findings of the ultrasound and there was no evidence of any CBD dilation.  No overt evidence of any stones in the CBD.  As a result of his symptoms and findings a GI consultation was requested.  Past Medical History  Diagnosis Date  . Asthma   . PTSD (post-traumatic stress disorder)     Past Surgical History  Procedure Laterality Date  . Colonoscopy      Family History  Problem Relation Age of Onset  . Diabetes Mother   . Hypotension Mother   . Hypertension Father   . CAD Father   . Diabetes Sister   . CAD Sister   . Hypertension Sister   . Diabetes Brother     Social History:  reports that he has never smoked. He has never used smokeless tobacco. He reports that he does not drink alcohol or use illicit drugs.  Allergies:  Allergies  Allergen Reactions  . Codeine Hives and Rash    Tolerates Dilaudid.  Chrisandra Netters (Shellfish Allergy) Shortness Of Breath    Patient stated not all shellfish just lobster    Medications:  Scheduled: . budesonide-formoterol  2 puff Inhalation BID  . lactated ringers   Intravenous STAT  . pantoprazole  40 mg Oral Daily  . sertraline  100 mg Oral Daily   Continuous: . sodium chloride      Results for orders placed during the hospital encounter  of 05/16/12 (from the past 24 hour(s))  CBC WITH DIFFERENTIAL     Status: None   Collection Time    05/16/12 11:15 PM      Result Value Range   WBC 5.5  4.0 - 10.5 K/uL   RBC 5.50  4.22 - 5.81 MIL/uL   Hemoglobin 15.1  13.0 - 17.0 g/dL   HCT 19.1  47.8 - 29.5 %   MCV 81.6  78.0 - 100.0 fL   MCH 27.5  26.0 - 34.0 pg   MCHC 33.6  30.0 - 36.0 g/dL   RDW 62.1  30.8 - 65.7 %   Platelets 276  150 - 400 K/uL   Neutrophils Relative 64  43 - 77 %   Neutro Abs 3.6  1.7 - 7.7 K/uL   Lymphocytes Relative 27  12 - 46 %   Lymphs Abs 1.5  0.7 - 4.0 K/uL   Monocytes Relative 7  3 - 12 %   Monocytes Absolute 0.4  0.1 - 1.0 K/uL   Eosinophils Relative 1  0 - 5 %   Eosinophils Absolute 0.1  0.0 - 0.7 K/uL   Basophils Relative 0  0 - 1 %   Basophils Absolute 0.0  0.0 - 0.1 K/uL  COMPREHENSIVE METABOLIC PANEL     Status: Abnormal   Collection Time    05/16/12 11:15  PM      Result Value Range   Sodium 140  135 - 145 mEq/L   Potassium 4.1  3.5 - 5.1 mEq/L   Chloride 102  96 - 112 mEq/L   CO2 27  19 - 32 mEq/L   Glucose, Bld 109 (*) 70 - 99 mg/dL   BUN 14  6 - 23 mg/dL   Creatinine, Ser 2.13  0.50 - 1.35 mg/dL   Calcium 9.6  8.4 - 08.6 mg/dL   Total Protein 8.6 (*) 6.0 - 8.3 g/dL   Albumin 3.9  3.5 - 5.2 g/dL   AST 578 (*) 0 - 37 U/L   ALT 833 (*) 0 - 53 U/L   Alkaline Phosphatase 160 (*) 39 - 117 U/L   Total Bilirubin 1.9 (*) 0.3 - 1.2 mg/dL   GFR calc non Af Amer >90  >90 mL/min   GFR calc Af Amer >90  >90 mL/min  LIPASE, BLOOD     Status: None   Collection Time    05/16/12 11:15 PM      Result Value Range   Lipase 34  11 - 59 U/L  URINALYSIS, ROUTINE W REFLEX MICROSCOPIC     Status: Abnormal   Collection Time    05/16/12 11:49 PM      Result Value Range   Color, Urine AMBER (*) YELLOW   APPearance CLEAR  CLEAR   Specific Gravity, Urine 1.031 (*) 1.005 - 1.030   pH 6.5  5.0 - 8.0   Glucose, UA NEGATIVE  NEGATIVE mg/dL   Hgb urine dipstick NEGATIVE  NEGATIVE   Bilirubin Urine  MODERATE (*) NEGATIVE   Ketones, ur NEGATIVE  NEGATIVE mg/dL   Protein, ur NEGATIVE  NEGATIVE mg/dL   Urobilinogen, UA 2.0 (*) 0.0 - 1.0 mg/dL   Nitrite NEGATIVE  NEGATIVE   Leukocytes, UA TRACE (*) NEGATIVE  URINE MICROSCOPIC-ADD ON     Status: Abnormal   Collection Time    05/16/12 11:49 PM      Result Value Range   Squamous Epithelial / LPF RARE  RARE   WBC, UA 0-2  <3 WBC/hpf   RBC / HPF 0-2  <3 RBC/hpf   Crystals CA OXALATE CRYSTALS (*) NEGATIVE   Urine-Other MUCOUS PRESENT    COMPREHENSIVE METABOLIC PANEL     Status: Abnormal   Collection Time    05/17/12  9:45 AM      Result Value Range   Sodium 142  135 - 145 mEq/L   Potassium 4.8  3.5 - 5.1 mEq/L   Chloride 107  96 - 112 mEq/L   CO2 26  19 - 32 mEq/L   Glucose, Bld 105 (*) 70 - 99 mg/dL   BUN 10  6 - 23 mg/dL   Creatinine, Ser 4.69  0.50 - 1.35 mg/dL   Calcium 8.9  8.4 - 62.9 mg/dL   Total Protein 7.4  6.0 - 8.3 g/dL   Albumin 3.3 (*) 3.5 - 5.2 g/dL   AST 528 (*) 0 - 37 U/L   ALT 608 (*) 0 - 53 U/L   Alkaline Phosphatase 165 (*) 39 - 117 U/L   Total Bilirubin 3.2 (*) 0.3 - 1.2 mg/dL   GFR calc non Af Amer >90  >90 mL/min   GFR calc Af Amer >90  >90 mL/min  MAGNESIUM     Status: None   Collection Time    05/17/12  9:45 AM      Result Value Range  Magnesium 2.1  1.5 - 2.5 mg/dL  PHOSPHORUS     Status: None   Collection Time    05/17/12  9:45 AM      Result Value Range   Phosphorus 2.4  2.3 - 4.6 mg/dL  CBC WITH DIFFERENTIAL     Status: None   Collection Time    05/17/12  9:45 AM      Result Value Range   WBC 4.5  4.0 - 10.5 K/uL   RBC 4.98  4.22 - 5.81 MIL/uL   Hemoglobin 13.5  13.0 - 17.0 g/dL   HCT 16.1  09.6 - 04.5 %   MCV 82.3  78.0 - 100.0 fL   MCH 27.1  26.0 - 34.0 pg   MCHC 32.9  30.0 - 36.0 g/dL   RDW 40.9  81.1 - 91.4 %   Platelets 251  150 - 400 K/uL   Neutrophils Relative 45  43 - 77 %   Neutro Abs 2.0  1.7 - 7.7 K/uL   Lymphocytes Relative 42  12 - 46 %   Lymphs Abs 1.9  0.7 - 4.0 K/uL    Monocytes Relative 9  3 - 12 %   Monocytes Absolute 0.4  0.1 - 1.0 K/uL   Eosinophils Relative 3  0 - 5 %   Eosinophils Absolute 0.2  0.0 - 0.7 K/uL   Basophils Relative 0  0 - 1 %   Basophils Absolute 0.0  0.0 - 0.1 K/uL  APTT     Status: None   Collection Time    05/17/12  9:45 AM      Result Value Range   aPTT 30  24 - 37 seconds  PROTIME-INR     Status: None   Collection Time    05/17/12  9:45 AM      Result Value Range   Prothrombin Time 14.0  11.6 - 15.2 seconds   INR 1.09  0.00 - 1.49     US Abdomen Complete  05/17/2012  *RADIOLOGY REPORT*  Clinical Data:  Elevated LFTs, abdominal pain  COMPLETE ABDOMINAL ULTRASOUND  Comparison:  None.  Findings:  Gallbladder:  Gallstones.  No gallbladder wall thickening or pericholecystic fluid. Negative sonographic Murphy's sign.  Common bile duct:  Prominent at 9 mm proximally.  The distal duct is obscured by overlying bowel gas artifact/limited acoustic windows.  Liver:  Diffusely increased liver echogenicity. There is a 2 cm hypoechoic area adjacent to the gallbladder fossa which may reflect fatty sparing.  IVC:  Appears normal.  Pancreas:  Poorly visualized.  Spleen:  Measures 8.4 cm oblique.  No focal abnormality.  Right Kidney:  Measures 10.3 cm.  No hydronephrosis or focal abnormality.  Left Kidney:  Measures 12.1 cm.  No hydronephrosis or focal abnormality.  Abdominal aorta:  No aneurysm identified.  IMPRESSION: Cholelithiasis without sonographic evidence for acute cholecystitis.  Nonspecific dilatationof of the proximal CBD. The distal CBD is obscured. Correlate with LFTs and ERCP if warranted.  Hepatic steatosis.  Hypoechoic area along the gallbladder fossa may reflect an area of fatty sparing.  This can be reevaluated with a 6- month ultrasound follow-up or non emergent MRI.   Original Report Authenticated By: Jearld Lesch, M.D.     ROS:  As stated above in the HPI otherwise negative.  Blood pressure 133/82, pulse 49, temperature  97.9 F (36.6 C), temperature source Oral, resp. rate 16, height 5\' 7"  (1.702 m), weight 215 lb (97.523 kg), SpO2 96.00%.    PE:  Gen: NAD, Alert and Oriented HEENT:  [place]/AT, EOMI Neck: Supple, no LAD Lungs: CTA Bilaterally CV: RRR without M/G/R ABM: Soft, epigastric tenderness, +BS Ext: No C/C/E  Assessment/Plan: 1) Cholelithiasis. 2) Abnormal liver enzymes. 3) Epigastric pain. 4) History of intestinal inflammation.   I cannot discern at this time if the patient has choledocholithiasis.  I discussed the situation with Radiology and I will pursue a HIDA.  Pending the HIDA scan results I will make further decisions, however, I do not feel he has choledocholithiasis.  The CBD is minimally dilated.  The abnormal liver enzymes can be as a result of his gallbladder stones/disease.  Plan: 1) HIDA scan today. 2) I will put him on the schedule for an EUS +/- ERCP tomorrow just in case the HIDA is not conclusive. 3) As for his history of intestinal inflammation, this can be evaluated on an outpatient basis.  In fact, he reports having 3 EGD/Colonoscopies with the [facility], which is where he received this diagnosis.  HUNG,PATRICK D 05/17/2012, 1:06 PM

## 2012-05-17 NOTE — ED Provider Notes (Signed)
Took over patient for PA. On my exam, patient's last epigastric and right upper quadrant tenderness this is consistent with prior exam, patient has likely obstructive biliary disease, AST and ALT are elevated with an alkaline phosphatase that is elevated with elevated bilirubin of 1.9. No scleral icterus. Patient is currently comfortable. He is afebrile and nontoxic. CT of the abdomen and pelvis shows minimal prominence of the common bile duct at 7 mm tapering along the course moving to the level of the ampulla - with LFTs question choledocholithiasis, patient to be admitted for ERCP discussed with Dr. Aundra Dubin of [facility] gastroenterology, discuss patient's case with Dr. Elisabeth Pigeon for admission.  She is stable for admission, pain is well-controlled at this point.  Jones Skene, MD 05/17/12 1610

## 2012-05-17 NOTE — Consult Note (Signed)
Reason for Consult: Cholelithiasis Referring Physician: Emilia Beck, PA-C  Harry Simmons is an 52 y.o. male.  HPI: 52 yr old male who presents to [facility] with 24 hour history of worsening abdominal pain, nasuea and vomiting.  He has had problems with his GI track since the early 1990s.  He has been followed by the [place] in [place].  His symptoms have included nausea, heartburn, GI bleeding, and abdominal pain.  He reports having at least 6 colonoscopies and several upper endoscopies.  He reports that they have not been able to diagnosis anything other then GERD.  His current symptoms are much worse then his chronic symptoms which prompted him to come to ER.    Past Medical History  Diagnosis Date  . Asthma   . PTSD (post-traumatic stress disorder)     Past Surgical History  Procedure Laterality Date  . Colonoscopy      Family History  Problem Relation Age of Onset  . Diabetes Mother   . Hypotension Mother   . Hypertension Father   . CAD Father   . Diabetes Sister   . CAD Sister   . Hypertension Sister   . Diabetes Brother     Social History:  reports that he has never smoked. He does not have any smokeless tobacco history on file. He reports that he does not drink alcohol or use illicit drugs.  Allergies:  Allergies  Allergen Reactions  . Codeine Hives and Rash  . Lobster (Shellfish Allergy) Shortness Of Breath    Patient stated not all shellfish just lobster    Medications: I have reviewed the patient's current medications.  Results for orders placed during the hospital encounter of 05/16/12 (from the past 48 hour(s))  CBC WITH DIFFERENTIAL     Status: None   Collection Time    05/16/12 11:15 PM      Result Value Range   WBC 5.5  4.0 - 10.5 K/uL   RBC 5.50  4.22 - 5.81 MIL/uL   Hemoglobin 15.1  13.0 - 17.0 g/dL   HCT 69.6  29.5 - 28.4 %   MCV 81.6  78.0 - 100.0 fL   MCH 27.5  26.0 - 34.0 pg   MCHC 33.6  30.0 - 36.0 g/dL   RDW 13.2  44.0 - 10.2 %   Platelets  276  150 - 400 K/uL   Neutrophils Relative 64  43 - 77 %   Neutro Abs 3.6  1.7 - 7.7 K/uL   Lymphocytes Relative 27  12 - 46 %   Lymphs Abs 1.5  0.7 - 4.0 K/uL   Monocytes Relative 7  3 - 12 %   Monocytes Absolute 0.4  0.1 - 1.0 K/uL   Eosinophils Relative 1  0 - 5 %   Eosinophils Absolute 0.1  0.0 - 0.7 K/uL   Basophils Relative 0  0 - 1 %   Basophils Absolute 0.0  0.0 - 0.1 K/uL  COMPREHENSIVE METABOLIC PANEL     Status: Abnormal   Collection Time    05/16/12 11:15 PM      Result Value Range   Sodium 140  135 - 145 mEq/L   Potassium 4.1  3.5 - 5.1 mEq/L   Chloride 102  96 - 112 mEq/L   CO2 27  19 - 32 mEq/L   Glucose, Bld 109 (*) 70 - 99 mg/dL   BUN 14  6 - 23 mg/dL   Creatinine, Ser 7.25  0.50 - 1.35 mg/dL  Calcium 9.6  8.4 - 10.5 mg/dL   Total Protein 8.6 (*) 6.0 - 8.3 g/dL   Albumin 3.9  3.5 - 5.2 g/dL   AST 161 (*) 0 - 37 U/L   ALT 833 (*) 0 - 53 U/L   Alkaline Phosphatase 160 (*) 39 - 117 U/L   Total Bilirubin 1.9 (*) 0.3 - 1.2 mg/dL   GFR calc non Af Amer >90  >90 mL/min   GFR calc Af Amer >90  >90 mL/min   Comment:            The eGFR has been calculated     using the CKD EPI equation.     This calculation has not been     validated in all clinical     situations.     eGFR's persistently     <90 mL/min signify     possible Chronic Kidney Disease.  LIPASE, BLOOD     Status: None   Collection Time    05/16/12 11:15 PM      Result Value Range   Lipase 34  11 - 59 U/L  URINALYSIS, ROUTINE W REFLEX MICROSCOPIC     Status: Abnormal   Collection Time    05/16/12 11:49 PM      Result Value Range   Color, Urine AMBER (*) YELLOW   Comment: BIOCHEMICALS MAY BE AFFECTED BY COLOR   APPearance CLEAR  CLEAR   Specific Gravity, Urine 1.031 (*) 1.005 - 1.030   pH 6.5  5.0 - 8.0   Glucose, UA NEGATIVE  NEGATIVE mg/dL   Hgb urine dipstick NEGATIVE  NEGATIVE   Bilirubin Urine MODERATE (*) NEGATIVE   Ketones, ur NEGATIVE  NEGATIVE mg/dL   Protein, ur NEGATIVE  NEGATIVE  mg/dL   Urobilinogen, UA 2.0 (*) 0.0 - 1.0 mg/dL   Nitrite NEGATIVE  NEGATIVE   Leukocytes, UA TRACE (*) NEGATIVE  URINE MICROSCOPIC-ADD ON     Status: Abnormal   Collection Time    05/16/12 11:49 PM      Result Value Range   Squamous Epithelial / LPF RARE  RARE   WBC, UA 0-2  <3 WBC/hpf   RBC / HPF 0-2  <3 RBC/hpf   Crystals CA OXALATE CRYSTALS (*) NEGATIVE   Urine-Other MUCOUS PRESENT      US Abdomen Complete  05/17/2012  *RADIOLOGY REPORT*  Clinical Data:  Elevated LFTs, abdominal pain  COMPLETE ABDOMINAL ULTRASOUND  Comparison:  None.  Findings:  Gallbladder:  Gallstones.  No gallbladder wall thickening or pericholecystic fluid. Negative sonographic Murphy's sign.  Common bile duct:  Prominent at 9 mm proximally.  The distal duct is obscured by overlying bowel gas artifact/limited acoustic windows.  Liver:  Diffusely increased liver echogenicity. There is a 2 cm hypoechoic area adjacent to the gallbladder fossa which may reflect fatty sparing.  IVC:  Appears normal.  Pancreas:  Poorly visualized.  Spleen:  Measures 8.4 cm oblique.  No focal abnormality.  Right Kidney:  Measures 10.3 cm.  No hydronephrosis or focal abnormality.  Left Kidney:  Measures 12.1 cm.  No hydronephrosis or focal abnormality.  Abdominal aorta:  No aneurysm identified.  IMPRESSION: Cholelithiasis without sonographic evidence for acute cholecystitis.  Nonspecific dilatationof of the proximal CBD. The distal CBD is obscured. Correlate with LFTs and ERCP if warranted.  Hepatic steatosis.  Hypoechoic area along the gallbladder fossa may reflect an area of fatty sparing.  This can be reevaluated with a 6- month ultrasound follow-up or non emergent MRI.  Original Report Authenticated By: Jearld Lesch, M.D.     Review of Systems  Constitutional: Negative.   HENT: Negative.   Eyes: Negative.   Respiratory: Negative.   Cardiovascular: Negative.   Gastrointestinal: Positive for heartburn, nausea, vomiting, abdominal  pain and blood in stool (last year).  Genitourinary: Negative.   Musculoskeletal: Negative.   Skin: Negative.   Neurological: Negative.   Endo/Heme/Allergies: Negative.   Psychiatric/Behavioral: Negative.    Blood pressure 116/73, pulse 57, temperature 98.1 F (36.7 C), temperature source Oral, resp. rate 15, height 5\' 7"  (1.702 m), weight 215 lb (97.523 kg), SpO2 97.00%. Physical Exam  Constitutional: He is oriented to person, place, and time. He appears well-developed and well-nourished. No distress.  HENT:  Head: Normocephalic and atraumatic.  Eyes: Conjunctivae are normal. Pupils are equal, round, and reactive to light.  Neck: Normal range of motion. Neck supple.  Cardiovascular: Normal rate and regular rhythm.   Respiratory: Effort normal and breath sounds normal.  GI: Bowel sounds are normal. He exhibits distension (mildly). He exhibits no mass. There is tenderness (RUQ). There is no guarding.  Musculoskeletal: Normal range of motion.  Neurological: He is alert and oriented to person, place, and time.  Skin: Skin is warm and dry.  Psychiatric: He has a normal mood and affect. His behavior is normal. Thought content normal.    Assessment/Plan: 1. Cholelithiasis with dilated CBD: US shows CBD at 9mm and shows cholelithiasis.  His LFTs and bilirubin are elevated.  It does not appear to have acute cholecystitis.  We will need for GI to see the patient to decide if ERCP is warranted.  Pending their consult we can decide about laparoscopic cholecystectomy.  His LFTs and bilirubin will need to normalize prior to surgical intervention.  He will need to be admitted and we will follow as consultants.  Will order lab work for am.  Discussed pathology of gallbladder disease and surgical treatment with the patient who expresses understanding.  Dr. Carolynne Edouard will see the patient later today.  WHITE, ELIZABETH 05/17/2012, 8:02 AM

## 2012-05-18 ENCOUNTER — Encounter (HOSPITAL_COMMUNITY): Payer: Self-pay | Admitting: *Deleted

## 2012-05-18 ENCOUNTER — Inpatient Hospital Stay (HOSPITAL_COMMUNITY): Payer: Federal, State, Local not specified - PPO

## 2012-05-18 ENCOUNTER — Encounter (HOSPITAL_COMMUNITY)
Admission: EM | Disposition: A | Discharge status: Home / Self Care | Payer: Self-pay | Source: Home / Self Care | Attending: Internal Medicine

## 2012-05-18 HISTORY — PX: ERCP: SHX5425

## 2012-05-18 LAB — COMPREHENSIVE METABOLIC PANEL
Albumin: 3.2 g/dL — ABNORMAL LOW (ref 3.5–5.2)
BUN: 10 mg/dL (ref 6–23)
Calcium: 8.8 mg/dL (ref 8.4–10.5)
Creatinine, Ser: 1 mg/dL (ref 0.50–1.35)
GFR calc Af Amer: >90 mL/min (ref >90)
Glucose, Bld: 96 mg/dL (ref 70–99)
Total Protein: 7 g/dL (ref 6.0–8.3)

## 2012-05-18 LAB — CBC
HCT: 39.8 % (ref 39.0–52.0)
Hemoglobin: 13.3 g/dL (ref 13.0–17.0)
RBC: 4.96 MIL/uL (ref 4.22–5.81)
RDW: 14.8 % (ref 11.5–15.5)
WBC: 5 10*3/uL (ref 4.0–10.5)

## 2012-05-18 SURGERY — ERCP, WITH INTERVENTION IF INDICATED
Anesthesia: Moderate Sedation

## 2012-05-18 SURGERY — ESOPHAGEAL ENDOSCOPIC ULTRASOUND (EUS) RADIAL
Anesthesia: Moderate Sedation

## 2012-05-18 MED ORDER — SODIUM CHLORIDE 0.9 % IV SOLN: INTRAVENOUS | Status: DC

## 2012-05-18 MED ORDER — DIPHENHYDRAMINE HCL 50 MG/ML IJ SOLN
INTRAMUSCULAR | Status: DC | PRN
  Administered 2012-05-18: 25 mg via INTRAVENOUS

## 2012-05-18 MED ORDER — FENTANYL CITRATE 0.05 MG/ML IJ SOLN
INTRAMUSCULAR | Status: DC | PRN
  Administered 2012-05-18 (×6): 25 ug via INTRAVENOUS

## 2012-05-18 MED ORDER — MIDAZOLAM HCL 10 MG/2ML IJ SOLN
INTRAMUSCULAR | Status: DC | PRN
  Administered 2012-05-18 (×4): 2 mg via INTRAVENOUS
  Administered 2012-05-18: 1 mg via INTRAVENOUS
  Administered 2012-05-18 (×3): 2 mg via INTRAVENOUS

## 2012-05-18 MED ORDER — GLUCAGON HCL (RDNA) 1 MG IJ SOLR
INTRAMUSCULAR | Status: AC
  Filled 2012-05-18: qty 1

## 2012-05-18 MED ORDER — SODIUM CHLORIDE 0.9 % IV SOLN
1.0000 g | Freq: Once | INTRAVENOUS | Status: AC
  Administered 2012-05-18: 1 g via INTRAVENOUS
  Filled 2012-05-18: qty 1000

## 2012-05-18 MED ORDER — BUTAMBEN-TETRACAINE-BENZOCAINE 2-2-14 % EX AERO
INHALATION_SPRAY | CUTANEOUS | Status: DC | PRN
  Administered 2012-05-18: 2 via TOPICAL

## 2012-05-18 MED ORDER — AMPICILLIN SODIUM 1 G IJ SOLR
1.0000 g | Freq: Once | INTRAMUSCULAR | Status: AC
  Administered 2012-05-18: 1 g via INTRAVENOUS
  Filled 2012-05-18 (×2): qty 1000

## 2012-05-18 MED ORDER — GLUCAGON HCL (RDNA) 1 MG IJ SOLR
INTRAMUSCULAR | Status: DC | PRN
  Administered 2012-05-18 (×4): .5 mg via INTRAVENOUS

## 2012-05-18 MED ORDER — HYDROMORPHONE HCL PF 1 MG/ML IJ SOLN
1.0000 mg | INTRAMUSCULAR | Status: DC | PRN
  Administered 2012-05-18 – 2012-05-20 (×7): 1 mg via INTRAVENOUS
  Filled 2012-05-18 (×7): qty 1

## 2012-05-18 NOTE — Progress Notes (Signed)
Patient ID: Harry Simmons, male   DOB: 04-11-1960, 52 y.o.   MRN: [id number]    Subjective: Feels rough this am, still having pain in right upper quad and did have some nausea yesterday and last night, denies vomiting  Objective: Vital signs in last 24 hours: Temp:  [97.9 F (36.6 C)-98.7 F (37.1 C)] 98.4 F (36.9 C) (03/11 9147) Pulse Rate:  [49-60] 60 (03/11 0638) Resp:  [16-18] 16 (03/11 0638) BP: (133-145)/(77-84) 144/77 mmHg (03/11 0638) SpO2:  [94 %-98 %] 94 % (03/11 0638) Last BM Date: 05/17/12  Intake/Output from previous day: 03/10 0701 - 03/11 0700 In: 2081.3 [P.O.:600; I.V.:1481.3] Out: 1075 [Urine:1075] Intake/Output this shift:    PE: Abd: soft but pretty tender in right UQ but no peritonitis, +bs General: appears uncomfortable but NAD, awake and alert  Lab Results:   Recent Labs  05/17/12 0945 05/18/12 0415  WBC 4.5 5.0  HGB 13.5 13.3  HCT 41.0 39.8  PLT 251 238   BMET  Recent Labs  05/17/12 0945 05/18/12 0415  NA 142 139  K 4.8 3.8  CL 107 104  CO2 26 25  GLUCOSE 105* 96  BUN 10 10  CREATININE 0.93 1.00  CALCIUM 8.9 8.8   PT/INR  Recent Labs  05/17/12 0945  LABPROT 14.0  INR 1.09   CMP     Component Value Date/Time   NA 139 05/18/2012 0415   K 3.8 05/18/2012 0415   CL 104 05/18/2012 0415   CO2 25 05/18/2012 0415   GLUCOSE 96 05/18/2012 0415   BUN 10 05/18/2012 0415   CREATININE 1.00 05/18/2012 0415   CALCIUM 8.8 05/18/2012 0415   PROT 7.0 05/18/2012 0415   ALBUMIN 3.2* 05/18/2012 0415   AST 128* 05/18/2012 0415   ALT 455* 05/18/2012 0415   ALKPHOS 172* 05/18/2012 0415   BILITOT 4.0* 05/18/2012 0415   GFRNONAA 86* 05/18/2012 0415   GFRAA >90 05/18/2012 0415   Lipase     Component Value Date/Time   LIPASE 34 05/16/2012 2315       Studies/Results: Nm Hepatobiliary  05/17/2012  *RADIOLOGY REPORT*  Clinical Data:  Elevated bilirubin.  NUCLEAR MEDICINE HEPATOBILIARY IMAGING  Technique:  Sequential images of the abdomen were obtained  out to 60 minutes following intravenous administration of radiopharmaceutical.  Radiopharmaceutical:  5.3mCi Tc-57m Choletec  Comparison:  I have CT 05/17/2012,  Findings: There is prompt extraction of radiotracer from the blood pool and homogeneous uptake within the liver.  There are no counts excreted into the common bile duct over the 95 minutes of imaging. No activity within the bowel.  The gallbladder is not filled.  The biliary tree is not identified readily.  IMPRESSION:  1.  No counts excreted into the biliary tree.  Legrand Rams this to represent  obstruction of the common bile duct or common hepatic duct.  Less likely differential would include acute hepatitis. 2.  Nonvisualization the gallbladder is likely secondary to obstruction of the common hepatic or  common bile duct.  Cannot evaluate patency of the cystic duct with this pattern.  Findings discussed Dr. Debroah Loop  on 05/18/2038 at 1600 hours   Original Report Authenticated By: Genevive Bi, M.D.    US Abdomen Complete  05/17/2012  *RADIOLOGY REPORT*  Clinical Data:  Elevated LFTs, abdominal pain  COMPLETE ABDOMINAL ULTRASOUND  Comparison:  None.  Findings:  Gallbladder:  Gallstones.  No gallbladder wall thickening or pericholecystic fluid. Negative sonographic Murphy's sign.  Common bile duct:  Prominent at 9  mm proximally.  The distal duct is obscured by overlying bowel gas artifact/limited acoustic windows.  Liver:  Diffusely increased liver echogenicity. There is a 2 cm hypoechoic area adjacent to the gallbladder fossa which may reflect fatty sparing.  IVC:  Appears normal.  Pancreas:  Poorly visualized.  Spleen:  Measures 8.4 cm oblique.  No focal abnormality.  Right Kidney:  Measures 10.3 cm.  No hydronephrosis or focal abnormality.  Left Kidney:  Measures 12.1 cm.  No hydronephrosis or focal abnormality.  Abdominal aorta:  No aneurysm identified.  IMPRESSION: Cholelithiasis without sonographic evidence for acute cholecystitis.  Nonspecific  dilatationof of the proximal CBD. The distal CBD is obscured. Correlate with LFTs and ERCP if warranted.  Hepatic steatosis.  Hypoechoic area along the gallbladder fossa may reflect an area of fatty sparing.  This can be reevaluated with a 6- month ultrasound follow-up or non emergent MRI.   Original Report Authenticated By: Jearld Lesch, M.D.    Ct Abdomen Pelvis W Contrast  05/17/2012  *RADIOLOGY REPORT*  Clinical Data:  Lower abdominal pain  CT ABDOMEN AND PELVIS WITH CONTRAST  Technique:  Multidetector CT imaging of the abdomen and pelvis was performed using the standard protocol following bolus administration of intravenous contrast.  Contrast: OMNIPAQUE IOHEXOL 300 MG/ML  SOLN 100 ml Omnipaque- 300  Comparison:  05/17/2012 ultrasound  Findings:  Limited images through the lung bases demonstrate no significant appreciable abnormality. The heart size is within normal limits. No pleural or pericardial effusion.  Hepatic steatosis.  Unremarkable spleen, pancreas, adrenal glands.  Gallstones.  No gallbladder wall thickening or pericholecystic fluid. Minimal prominence of the CBD at 7 mm, tapering along the course smoothly to the level of the ampulla.  Symmetric renal enhancement.  No hydronephrosis or hydroureter  No CT evidence for colitis.  Normal appendix.  Small bowel loops are normal course and caliber.  Nonspecific gastric distension.  No free intraperitoneal air or fluid.  Tiny umbilical hernia contains fat.  No lymphadenopathy.  Normal caliber aorta and branch vessels.  Decompressed bladder.  No acute osseous finding. L5 pars defects.  IMPRESSION:  Hepatic steatosis.  Gallstones and nonspecific mild wall thickening.  Correlate with HIDA scan if clinical concern for acute cholecystitis persists.   Original Report Authenticated By: Jearld Lesch, M.D.     Anti-infectives: Anti-infectives   None       Assessment/Plan 1. Cholelithiasis with dilated CBD: bilirubin continuing to trend  up, likely needs ERCP today, will need to follow labs, keep NPO for now, we will check GI's recommendations later this AM   LOS: 2 days    WHITE, [facility] 05/18/2012

## 2012-05-18 NOTE — Progress Notes (Signed)
[facility] PROGRESS NOTE  Harry Simmons AVW:[id number] DOB: [birth year] DOA: 05/16/2012 PCP: Arnie Maiolo, MD  Assessment/Plan: Abdominal pain, epigastric  Secondary to cholelithiasis  Liver enzymes elevated as mentioned above: AST/ALT/ALT 237/608/165 respectively and bilirubin of 3.2  Appreciate GI following. s/p HIDA scan 3/10 and plan for ERCP today by Dr. hung  Abnormal liver function tests  Secondary to cholelithiasis  Trend LFTs   Code Status: full Family Communication: patient/mother Disposition Plan:    Consultants:  GI  surgery  Procedures:    Antibiotics:    HPI/Subjective: Patient is feeling better after pain medications   Objective: Filed Vitals:   05/17/12 2008 05/17/12 2135 05/18/12 0638 05/18/12 1331  BP:  145/84 144/77 134/85  Pulse:  60 60   Temp:  98.7 F (37.1 C) 98.4 F (36.9 C) 98 F (36.7 C)  TempSrc:  Oral Oral Oral  Resp:  18 16 9   Height:      Weight:      SpO2: 96% 98% 94% 97%    Intake/Output Summary (Last 24 hours) at 05/18/12 1334 Last data filed at 05/18/12 1000  Gross per 24 hour  Intake 2081.25 ml  Output   1075 ml  Net 1006.25 ml   Filed Weights   05/16/12 2007  Weight: 97.523 kg (215 lb)    Exam:   General:  A+Ox3, NAD  Cardiovascular: rrr  Respiratory: clear  Abdomen: soft but tender in RUQ  Musculoskeletal: moves all 4 ext   Data Reviewed: Basic Metabolic Panel:  Recent Labs Lab 05/16/12 2315 05/17/12 0945 05/18/12 0415  NA 140 142 139  K 4.1 4.8 3.8  CL 102 107 104  CO2 27 26 25   GLUCOSE 109* 105* 96  BUN 14 10 10   CREATININE 0.95 0.93 1.00  CALCIUM 9.6 8.9 8.8  MG  --  2.1  --   PHOS  --  2.4  --    Liver Function Tests:  Recent Labs Lab 05/16/12 2315 05/17/12 0945 05/18/12 0415  AST 487* 237* 128*  ALT 833* 608* 455*  ALKPHOS 160* 165* 172*  BILITOT 1.9* 3.2* 4.0*  PROT 8.6* 7.4 7.0  ALBUMIN 3.9 3.3* 3.2*    Recent Labs Lab 05/16/12 2315  LIPASE 34    No results found for this basename: AMMONIA,  in the last 168 hours CBC:  Recent Labs Lab 05/16/12 2315 05/17/12 0945 05/18/12 0415  WBC 5.5 4.5 5.0  NEUTROABS 3.6 2.0  --   HGB 15.1 13.5 13.3  HCT 44.9 41.0 39.8  MCV 81.6 82.3 80.2  PLT 276 251 238   Cardiac Enzymes: No results found for this basename: CKTOTAL, CKMB, CKMBINDEX, TROPONINI,  in the last 168 hours BNP (last 3 results) No results found for this basename: PROBNP,  in the last 8760 hours CBG: No results found for this basename: GLUCAP,  in the last 168 hours  No results found for this or any previous visit (from the past 240 hour(s)).   Studies: Nm Hepatobiliary  05/17/2012  *RADIOLOGY REPORT*  Clinical Data:  Elevated bilirubin.  NUCLEAR MEDICINE HEPATOBILIARY IMAGING  Technique:  Sequential images of the abdomen were obtained out to 60 minutes following intravenous administration of radiopharmaceutical.  Radiopharmaceutical:  5.37mCi Tc-31m Choletec  Comparison:  I have CT 05/17/2012,  Findings: There is prompt extraction of radiotracer from the blood pool and homogeneous uptake within the liver.  There are no counts excreted into the common bile duct over the 95 minutes of imaging. No activity  within the bowel.  The gallbladder is not filled.  The biliary tree is not identified readily.  IMPRESSION:  1.  No counts excreted into the biliary tree.  Legrand Rams this to represent  obstruction of the common bile duct or common hepatic duct.  Less likely differential would include acute hepatitis. 2.  Nonvisualization the gallbladder is likely secondary to obstruction of the common hepatic or  common bile duct.  Cannot evaluate patency of the cystic duct with this pattern.  Findings discussed Dr. Debroah Loop  on 05/18/2038 at 1600 hours   Original Report Authenticated By: Genevive Bi, M.D.    US Abdomen Complete  05/17/2012  *RADIOLOGY REPORT*  Clinical Data:  Elevated LFTs, abdominal pain  COMPLETE ABDOMINAL ULTRASOUND  Comparison:   None.  Findings:  Gallbladder:  Gallstones.  No gallbladder wall thickening or pericholecystic fluid. Negative sonographic Murphy's sign.  Common bile duct:  Prominent at 9 mm proximally.  The distal duct is obscured by overlying bowel gas artifact/limited acoustic windows.  Liver:  Diffusely increased liver echogenicity. There is a 2 cm hypoechoic area adjacent to the gallbladder fossa which may reflect fatty sparing.  IVC:  Appears normal.  Pancreas:  Poorly visualized.  Spleen:  Measures 8.4 cm oblique.  No focal abnormality.  Right Kidney:  Measures 10.3 cm.  No hydronephrosis or focal abnormality.  Left Kidney:  Measures 12.1 cm.  No hydronephrosis or focal abnormality.  Abdominal aorta:  No aneurysm identified.  IMPRESSION: Cholelithiasis without sonographic evidence for acute cholecystitis.  Nonspecific dilatationof of the proximal CBD. The distal CBD is obscured. Correlate with LFTs and ERCP if warranted.  Hepatic steatosis.  Hypoechoic area along the gallbladder fossa may reflect an area of fatty sparing.  This can be reevaluated with a 6- month ultrasound follow-up or non emergent MRI.   Original Report Authenticated By: Jearld Lesch, M.D.    Ct Abdomen Pelvis W Contrast  05/17/2012  *RADIOLOGY REPORT*  Clinical Data:  Lower abdominal pain  CT ABDOMEN AND PELVIS WITH CONTRAST  Technique:  Multidetector CT imaging of the abdomen and pelvis was performed using the standard protocol following bolus administration of intravenous contrast.  Contrast: OMNIPAQUE IOHEXOL 300 MG/ML  SOLN 100 ml Omnipaque- 300  Comparison:  05/17/2012 ultrasound  Findings:  Limited images through the lung bases demonstrate no significant appreciable abnormality. The heart size is within normal limits. No pleural or pericardial effusion.  Hepatic steatosis.  Unremarkable spleen, pancreas, adrenal glands.  Gallstones.  No gallbladder wall thickening or pericholecystic fluid. Minimal prominence of the CBD at 7 mm, tapering  along the course smoothly to the level of the ampulla.  Symmetric renal enhancement.  No hydronephrosis or hydroureter  No CT evidence for colitis.  Normal appendix.  Small bowel loops are normal course and caliber.  Nonspecific gastric distension.  No free intraperitoneal air or fluid.  Tiny umbilical hernia contains fat.  No lymphadenopathy.  Normal caliber aorta and branch vessels.  Decompressed bladder.  No acute osseous finding. L5 pars defects.  IMPRESSION:  Hepatic steatosis.  Gallstones and nonspecific mild wall thickening.  Correlate with HIDA scan if clinical concern for acute cholecystitis persists.   Original Report Authenticated By: Jearld Lesch, M.D.     Scheduled Meds: . budesonide-formoterol  2 puff Inhalation BID  . pantoprazole  40 mg Oral Daily  . sertraline  100 mg Oral Daily   Continuous Infusions: . sodium chloride 75 mL/hr at 05/18/12 1302  . sodium chloride  Principal Problem:   Abdominal pain, epigastric Active Problems:   Abnormal liver function tests    Time spent: 79    [facility], JESSICA  [facility] Pager [phone] If 7PM-7AM, please contact night-coverage at [email or web address], password [facility] 05/18/2012, 1:34 PM  LOS: 2 days

## 2012-05-18 NOTE — Interval H&P Note (Signed)
History and Physical Interval Note:  05/18/2012 2:52 PM  Harry Simmons  has presented today for surgery, with the diagnosis of Possible choledocholithiasis  The various methods of treatment have been discussed with the patient and family. After consideration of risks, benefits and other options for treatment, the patient has consented to  Procedure(s): ENDOSCOPIC RETROGRADE CHOLANGIOPANCREATOGRAPHY (ERCP) (N/A) as a surgical intervention .  The patient's history has been reviewed, patient examined, no change in status, stable for surgery.  I have reviewed the patient's chart and labs.  Questions were answered to the patient's satisfaction.     HUNG,PATRICK D

## 2012-05-18 NOTE — H&P (View-Only) (Signed)
[facility] PROGRESS NOTE  Cordai J Brusca MRN:[id number] DOB: 06/22/1960 DOA: 05/16/2012 PCP: Default, Provider, MD  Assessment/Plan: Abdominal pain, epigastric  Secondary to cholelithiasis  Liver enzymes elevated as mentioned above: AST/ALT/ALT 237/608/165 respectively and bilirubin of 3.2  Appreciate GI following. s/p HIDA scan 3/10 and plan for ERCP today by Dr. hung  Abnormal liver function tests  Secondary to cholelithiasis  Trend LFTs   Code Status: full Family Communication: patient/mother Disposition Plan:    Consultants:  GI  surgery  Procedures:    Antibiotics:    HPI/Subjective: Patient is feeling better after pain medications   Objective: Filed Vitals:   05/17/12 2008 05/17/12 2135 05/18/12 0638 05/18/12 1331  BP:  145/84 144/77 134/85  Pulse:  60 60   Temp:  98.7 F (37.1 C) 98.4 F (36.9 C) 98 F (36.7 C)  TempSrc:  Oral Oral Oral  Resp:  18 16 9  Height:      Weight:      SpO2: 96% 98% 94% 97%    Intake/Output Summary (Last 24 hours) at 05/18/12 1334 Last data filed at 05/18/12 1000  Gross per 24 hour  Intake 2081.25 ml  Output   1075 ml  Net 1006.25 ml   Filed Weights   05/16/12 2007  Weight: 97.523 kg (215 lb)    Exam:   General:  A+Ox3, NAD  Cardiovascular: rrr  Respiratory: clear  Abdomen: soft but tender in RUQ  Musculoskeletal: moves all 4 ext   Data Reviewed: Basic Metabolic Panel:  Recent Labs Lab 05/16/12 2315 05/17/12 0945 05/18/12 0415  NA 140 142 139  K 4.1 4.8 3.8  CL 102 107 104  CO2 27 26 25  GLUCOSE 109* 105* 96  BUN 14 10 10  CREATININE 0.95 0.93 1.00  CALCIUM 9.6 8.9 8.8  MG  --  2.1  --   PHOS  --  2.4  --    Liver Function Tests:  Recent Labs Lab 05/16/12 2315 05/17/12 0945 05/18/12 0415  AST 487* 237* 128*  ALT 833* 608* 455*  ALKPHOS 160* 165* 172*  BILITOT 1.9* 3.2* 4.0*  PROT 8.6* 7.4 7.0  ALBUMIN 3.9 3.3* 3.2*    Recent Labs Lab 05/16/12 2315  LIPASE 34    No results found for this basename: AMMONIA,  in the last 168 hours CBC:  Recent Labs Lab 05/16/12 2315 05/17/12 0945 05/18/12 0415  WBC 5.5 4.5 5.0  NEUTROABS 3.6 2.0  --   HGB 15.1 13.5 13.3  HCT 44.9 41.0 39.8  MCV 81.6 82.3 80.2  PLT 276 251 238   Cardiac Enzymes: No results found for this basename: CKTOTAL, CKMB, CKMBINDEX, TROPONINI,  in the last 168 hours BNP (last 3 results) No results found for this basename: PROBNP,  in the last 8760 hours CBG: No results found for this basename: GLUCAP,  in the last 168 hours  No results found for this or any previous visit (from the past 240 hour(s)).   Studies: Nm Hepatobiliary  05/17/2012  *RADIOLOGY REPORT*  Clinical Data:  Elevated bilirubin.  NUCLEAR MEDICINE HEPATOBILIARY IMAGING  Technique:  Sequential images of the abdomen were obtained out to 60 minutes following intravenous administration of radiopharmaceutical.  Radiopharmaceutical:  5.5mCi Tc-99m Choletec  Comparison:  I have CT 05/17/2012,  Findings: There is prompt extraction of radiotracer from the blood pool and homogeneous uptake within the liver.  There are no counts excreted into the common bile duct over the 95 minutes of imaging. No activity   within the bowel.  The gallbladder is not filled.  The biliary tree is not identified readily.  IMPRESSION:  1.  No counts excreted into the biliary tree.  Favor this to represent  obstruction of the common bile duct or common hepatic duct.  Less likely differential would include acute hepatitis. 2.  Nonvisualization the gallbladder is likely secondary to obstruction of the common hepatic or  common bile duct.  Cannot evaluate patency of the cystic duct with this pattern.  Findings discussed Dr. Heung  on 05/18/2038 at 1600 hours   Original Report Authenticated By: Stewart Edmunds, M.D.    Us Abdomen Complete  05/17/2012  *RADIOLOGY REPORT*  Clinical Data:  Elevated LFTs, abdominal pain  COMPLETE ABDOMINAL ULTRASOUND  Comparison:   None.  Findings:  Gallbladder:  Gallstones.  No gallbladder wall thickening or pericholecystic fluid. Negative sonographic Murphy's sign.  Common bile duct:  Prominent at 9 mm proximally.  The distal duct is obscured by overlying bowel gas artifact/limited acoustic windows.  Liver:  Diffusely increased liver echogenicity. There is a 2 cm hypoechoic area adjacent to the gallbladder fossa which may reflect fatty sparing.  IVC:  Appears normal.  Pancreas:  Poorly visualized.  Spleen:  Measures 8.4 cm oblique.  No focal abnormality.  Right Kidney:  Measures 10.3 cm.  No hydronephrosis or focal abnormality.  Left Kidney:  Measures 12.1 cm.  No hydronephrosis or focal abnormality.  Abdominal aorta:  No aneurysm identified.  IMPRESSION: Cholelithiasis without sonographic evidence for acute cholecystitis.  Nonspecific dilatationof of the proximal CBD. The distal CBD is obscured. Correlate with LFTs and ERCP if warranted.  Hepatic steatosis.  Hypoechoic area along the gallbladder fossa may reflect an area of fatty sparing.  This can be reevaluated with a 6- month ultrasound follow-up or non emergent MRI.   Original Report Authenticated By: Andrew  DelGaizo, M.D.    Ct Abdomen Pelvis W Contrast  05/17/2012  *RADIOLOGY REPORT*  Clinical Data:  Lower abdominal pain  CT ABDOMEN AND PELVIS WITH CONTRAST  Technique:  Multidetector CT imaging of the abdomen and pelvis was performed using the standard protocol following bolus administration of intravenous contrast.  Contrast: 100mL OMNIPAQUE IOHEXOL 300 MG/ML  SOLN 100 ml Omnipaque- 300  Comparison:  05/17/2012 ultrasound  Findings:  Limited images through the lung bases demonstrate no significant appreciable abnormality. The heart size is within normal limits. No pleural or pericardial effusion.  Hepatic steatosis.  Unremarkable spleen, pancreas, adrenal glands.  Gallstones.  No gallbladder wall thickening or pericholecystic fluid. Minimal prominence of the CBD at 7 mm, tapering  along the course smoothly to the level of the ampulla.  Symmetric renal enhancement.  No hydronephrosis or hydroureter  No CT evidence for colitis.  Normal appendix.  Small bowel loops are normal course and caliber.  Nonspecific gastric distension.  No free intraperitoneal air or fluid.  Tiny umbilical hernia contains fat.  No lymphadenopathy.  Normal caliber aorta and branch vessels.  Decompressed bladder.  No acute osseous finding. L5 pars defects.  IMPRESSION:  Hepatic steatosis.  Gallstones and nonspecific mild wall thickening.  Correlate with HIDA scan if clinical concern for acute cholecystitis persists.   Original Report Authenticated By: Andrew  DelGaizo, M.D.     Scheduled Meds: . budesonide-formoterol  2 puff Inhalation BID  . pantoprazole  40 mg Oral Daily  . sertraline  100 mg Oral Daily   Continuous Infusions: . sodium chloride 75 mL/hr at 05/18/12 1302  . sodium chloride        Principal Problem:   Abdominal pain, epigastric Active Problems:   Abnormal liver function tests    Time spent: 35    VANN, JESSICA  [facility] Pager [phone] If 7PM-7AM, please contact night-coverage at [email or web address], password [email or web address] 05/18/2012, 1:34 PM  LOS: 2 days             

## 2012-05-18 NOTE — Op Note (Signed)
[facility] [address] [place], [address]   ERCP PROCEDURE REPORT  PATIENT: Harry Simmons, Harry Simmons.  MR# :[id number] BIRTHDATE: Jun 14, 1960  GENDER: Male ENDOSCOPIST: Jeani Hawking, MD REFERRED BY: PROCEDURE DATE:  05/18/2012 PROCEDURE:   ERCP, diagnostic ASA CLASS:   Class III INDICATIONS:abnormal abdominal CT. MEDICATIONS: Versed 15 mg IV, Fentanyl 150 mcg IV, Benadryl 25 mg IV, and Glucagon 1.5 mg IV TOPICAL ANESTHETIC: Cetacaine Spray  DESCRIPTION OF PROCEDURE:   After the risks benefits and alternatives of the procedure were thoroughly explained, informed consent was obtained.  The Pentax ERCP [id number]  endoscope was introduced through the mouth  and advanced to the second portion of the duodenum .  The ampulla was located in the second portion of the duodenum.  It was a very large and floppy papilla.  Despite multiple attempts cannulation of the CBD or PD was not able to be accomplished.  once the patient started to become restless the procedure was aborted. Total scope time was approximately 1 hour 45 min.   The scope was then completely withdrawn from the patient and the procedure terminated.     COMPLICATIONS: .  There were no complications.  ENDOSCOPIC IMPRESSION: 1) Large floppy ampulla.  RECOMMENDATIONS: 1) MRCP to definitively define any stones in the CBD. 2) Pending the results, I have asked Dr.  Leone Payor to consider a repeat ERCP, if required.     _______________________________ eSigned:  Jeani Hawking, MD 05/18/2012 4:59 PM   CC:

## 2012-05-19 ENCOUNTER — Encounter (HOSPITAL_COMMUNITY): Payer: Self-pay | Admitting: Gastroenterology

## 2012-05-19 ENCOUNTER — Inpatient Hospital Stay (HOSPITAL_COMMUNITY): Payer: Federal, State, Local not specified - PPO

## 2012-05-19 LAB — COMPREHENSIVE METABOLIC PANEL
ALT: 277 U/L — ABNORMAL HIGH (ref 0–53)
AST: 47 U/L — ABNORMAL HIGH (ref 0–37)
Alkaline Phosphatase: 166 U/L — ABNORMAL HIGH (ref 39–117)
BUN: 10 mg/dL (ref 6–23)
CO2: 24 mEq/L (ref 19–32)
CO2: 26 mEq/L (ref 19–32)
Calcium: 9.2 mg/dL (ref 8.4–10.5)
Chloride: 105 mEq/L (ref 96–112)
Creatinine, Ser: 1.01 mg/dL (ref 0.50–1.35)
Creatinine, Ser: 0.98 mg/dL (ref 0.50–1.35)
GFR calc Af Amer: >90 mL/min (ref >90)
GFR calc non Af Amer: 85 mL/min — ABNORMAL LOW (ref >90)
GFR calc non Af Amer: >90 mL/min (ref >90)
Glucose, Bld: 88 mg/dL (ref 70–99)
Potassium: 3.9 mEq/L (ref 3.5–5.1)
Sodium: 137 mEq/L (ref 135–145)
Total Bilirubin: 1.4 mg/dL — ABNORMAL HIGH (ref 0.3–1.2)
Total Protein: 7.9 g/dL (ref 6.0–8.3)

## 2012-05-19 LAB — GLUCOSE, CAPILLARY: Glucose-Capillary: 93 mg/dL (ref 70–99)

## 2012-05-19 MED ORDER — GADOBENATE DIMEGLUMINE 529 MG/ML IV SOLN
20.0000 mL | Freq: Once | INTRAVENOUS | Status: AC | PRN
  Administered 2012-05-19: 20 mL via INTRAVENOUS

## 2012-05-19 NOTE — Progress Notes (Signed)
Physical Therapy Discharge Patient Details Name: Harry Simmons MRN: [id number] DOB: [birth year] Today's Date: 05/19/2012 Time: 1235-     Patient discharged from PT services secondary to pt is ambulating w/ staff/family. Going for further procedures. Please reorder if indicated. Thank you. Progress and discharge plan discussed with patient and/or caregiver: GP     Rada Hay 05/19/2012, 12:35 PM

## 2012-05-19 NOTE — Progress Notes (Signed)
HIDA favors obstruction of common bile duct. He will need ERCP before surgery

## 2012-05-19 NOTE — Progress Notes (Signed)
Subjective: Patient seems to be doing well on clears. No evidence of CBD obstruction on MRCP. Gallstones noted. Patient still requiring Dilaudid for pain control. Denies having any nausea or vomiting.   Objective: Vital signs in last 24 hours: Temp:  [97.5 F (36.4 C)-98.2 F (36.8 C)] 98.2 F (36.8 C) (03/12 1430) Pulse Rate:  [55-57] 57 (03/12 1430) Resp:  [18-20] 20 (03/12 0556) BP: (113-152)/(72-92) 152/92 mmHg (03/12 1430) SpO2:  [95 %-98 %] 98 % (03/12 1430) Last BM Date: 05/16/12  Intake/Output from previous day: 03/11 0701 - 03/12 0700 In: 2280 [P.O.:480; I.V.:1800] Out: 1450 [Urine:1450] Intake/Output this shift: Total I/O In: 600 [I.V.:600] Out: 725 [Urine:725]  General appearance: alert, cooperative, appears stated age and no distress Resp: clear to auscultation bilaterally Cardio: regular rate and rhythm, S1, S2 normal, no murmur, click, rub or gallop GI: soft, non-tender; bowel sounds normal; no masses,  no organomegaly Extremities: extremities normal, atraumatic, no cyanosis or edema  Lab Results:  Recent Labs  05/16/12 2315 05/17/12 0945 05/18/12 0415  WBC 5.5 4.5 5.0  HGB 15.1 13.5 13.3  HCT 44.9 41.0 39.8  PLT 276 251 238   BMET  Recent Labs  05/17/12 0945 05/18/12 0415 05/19/12 0409  NA 142 139 139  K 4.8 3.8 3.9  CL 107 104 105  CO2 26 25 24   GLUCOSE 105* 96 88  BUN 10 10 10   CREATININE 0.93 1.00 1.01  CALCIUM 8.9 8.8 8.6   LFT  Recent Labs  05/19/12 0409  PROT 6.8  ALBUMIN 3.0*  AST 58*  ALT 305*  ALKPHOS 166*  BILITOT 1.4*   PT/INR  Recent Labs  05/17/12 0945  LABPROT 14.0  INR 1.09   Studies/Results: Mr 3d Recon At Scanner  05/19/2012  *RADIOLOGY REPORT*  Clinical Data:  Cholelithiasis with dilated common bile duct.  Lack of excretion of contrast into the common bile duct.  MRI ABDOMEN WITHOUT AND WITH CONTRAST (MRCP)  Technique:  Multiplanar multisequence MR imaging of the abdomen was performed without and with  contrast, including heavily T2-weighted images of the biliary and pancreatic ducts.  Three-dimensional MR images were rendered by post processing of the original MR data.  Contrast: 20mL MULTIHANCE GADOBENATE DIMEGLUMINE 529 MG/ML IV SOLN  Comparison:  Multiple exams, including 05/17/2012  Findings:  Two gallstones each measuring up to 2 cm are identified with gallbladder wall thickening and a small amount of pericholecystic fluid.  Mild intrahepatic bile duct dilatation is present along with mild common hepatic duct dilatation at 1 cm, and mild common bile duct dilatation and 0.6 cm.  The cystic duct attachment is somewhat low in position.  Dorsal pancreatic duct unremarkable.  MRCP does not reveal compelling findings of stricture or filling defect in the common bile duct on today's exam; there is smooth conical tapering of the duct shown on image 27 of series 13.  No obvious enhancing ampullary lesion.  The pancreas enhances normally.  A peripancreatic lymph node is mildly enlarged at 1.1 cm in short axis on image 39 of series 1501. No definite irregularity of the common bile duct is observed.  Two small lesions in segment 2 of the liver demonstrate initial peripheral marginal nodular enhancement with subsequent fill-in and delayed enhancement, characteristic of a small hemangioma.  No significant renal, splenic, or adrenal abnormality.  There is a suggestion of trace free pelvic fluid on the coronal images.  IMPRESSION:  1.  Mild intra- and extrahepatic biliary dilatation, with smooth conical tapering of the  distal common bile duct in the vicinity of the ampulla, but without a visible filling defect or irregularity. The possibility of a recently passed stone causing stenosis at the ampulla is raised as a possibility given the lack of other explaining factors for the lack of bowel excretion and biliary dilatation.  Occult stone or occult stricture could likewise be considered. 2.  Gallbladder wall thickening with  pericholecystic fluid and gallstones - correlate clinically in assessing for cholecystitis. 3.  There are too small hemangioma is in segment two of the liver.  4.  Mildly enlarged peripancreatic lymph node, likely reactive. 5.  Incidentally, the attachment of the cystic duct to the common hepatic duct/common bile duct is somewhat low in position, along the superior margin of the pancreatic head.   Original Report Authenticated By: Gaylyn Rong, M.D.    Dg C-arm 61-120 Min-no Report  05/18/2012  CLINICAL DATA: CBD Stones   C-ARM 61-120 MINUTES  Fluoroscopy was utilized by the requesting physician.  No radiographic  interpretation.     Mr Harry Simmons  05/19/2012  *RADIOLOGY REPORT*  Clinical Data:  Cholelithiasis with dilated common bile duct.  Lack of excretion of contrast into the common bile duct.  MRI ABDOMEN WITHOUT AND WITH CONTRAST (MRCP)  Technique:  Multiplanar multisequence MR imaging of the abdomen was performed without and with contrast, including heavily T2-weighted images of the biliary and pancreatic ducts.  Three-dimensional MR images were rendered by post processing of the original MR data.  Contrast: 20mL MULTIHANCE GADOBENATE DIMEGLUMINE 529 MG/ML IV SOLN  Comparison:  Multiple exams, including 05/17/2012  Findings:  Two gallstones each measuring up to 2 cm are identified with gallbladder wall thickening and a small amount of pericholecystic fluid.  Mild intrahepatic bile duct dilatation is present along with mild common hepatic duct dilatation at 1 cm, and mild common bile duct dilatation and 0.6 cm.  The cystic duct attachment is somewhat low in position.  Dorsal pancreatic duct unremarkable.  MRCP does not reveal compelling findings of stricture or filling defect in the common bile duct on today's exam; there is smooth conical tapering of the duct shown on image 27 of series 13.  No obvious enhancing ampullary lesion.  The pancreas enhances normally.  A peripancreatic lymph node  is mildly enlarged at 1.1 cm in short axis on image 39 of series 1501. No definite irregularity of the common bile duct is observed.  Two small lesions in segment 2 of the liver demonstrate initial peripheral marginal nodular enhancement with subsequent fill-in and delayed enhancement, characteristic of a small hemangioma.  No significant renal, splenic, or adrenal abnormality.  There is a suggestion of trace free pelvic fluid on the coronal images.  IMPRESSION:  1.  Mild intra- and extrahepatic biliary dilatation, with smooth conical tapering of the distal common bile duct in the vicinity of the ampulla, but without a visible filling defect or irregularity. The possibility of a recently passed stone causing stenosis at the ampulla is raised as a possibility given the lack of other explaining factors for the lack of bowel excretion and biliary dilatation.  Occult stone or occult stricture could likewise be considered. 2.  Gallbladder wall thickening with pericholecystic fluid and gallstones - correlate clinically in assessing for cholecystitis. 3.  There are too small hemangioma is in segment two of the liver.  4.  Mildly enlarged peripancreatic lymph node, likely reactive. 5.  Incidentally, the attachment of the cystic duct to the common hepatic duct/common bile duct  is somewhat low in position, along the superior margin of the pancreatic head.   Original Report Authenticated By: Gaylyn Rong, M.D.    Medications: I have reviewed the patient's current medications.  Assessment/Plan: 1) Cholelithiasis with abnormal LFT's : His transaminases seem to be improving. He would benefit from a cholecystectomy with an IOC.  2) CRC screening: patient tells me he gets his screening done at the [facility] in [place]. He had a colonoscopy last year.  3) Asthma on Albuterol and Symbicort.  Please call as needed.   LOS: 3 days   Chad Donoghue 05/19/2012, 6:33 PM

## 2012-05-19 NOTE — Progress Notes (Signed)
Patient ID: Harry Simmons, male   DOB: 10/18/1960, 52 y.o.   MRN: [id number] 1 Day Post-Op  Subjective: Feels much better today, abd sore at times but most of pain has resolved, denies n/v  Objective: Vital signs in last 24 hours: Temp:  [97.5 F (36.4 C)-98 F (36.7 C)] 97.9 F (36.6 C) (03/12 0556) Pulse Rate:  [52-220] 62 (03/11 1700) Resp:  [9-24] 20 (03/12 0556) BP: (113-185)/(71-113) 113/72 mmHg (03/12 0556) SpO2:  [83 %-100 %] 95 % (03/12 0556) Last BM Date: 05/16/12  Intake/Output from previous day: 03/11 0701 - 03/12 0700 In: 2280 [P.O.:480; I.V.:1800] Out: 1450 [Urine:1450] Intake/Output this shift:    PE: Abd: soft but less tender in right UQ, no peritonitis, +bs General: appears uncomfortable but NAD, awake and alert  Lab Results:   Recent Labs  05/17/12 0945 05/18/12 0415  WBC 4.5 5.0  HGB 13.5 13.3  HCT 41.0 39.8  PLT 251 238   BMET  Recent Labs  05/18/12 0415 05/19/12 0409  NA 139 139  K 3.8 3.9  CL 104 105  CO2 25 24  GLUCOSE 96 88  BUN 10 10  CREATININE 1.00 1.01  CALCIUM 8.8 8.6   PT/INR  Recent Labs  05/17/12 0945  LABPROT 14.0  INR 1.09   CMP     Component Value Date/Time   NA 139 05/19/2012 0409   K 3.9 05/19/2012 0409   CL 105 05/19/2012 0409   CO2 24 05/19/2012 0409   GLUCOSE 88 05/19/2012 0409   BUN 10 05/19/2012 0409   CREATININE 1.01 05/19/2012 0409   CALCIUM 8.6 05/19/2012 0409   PROT 6.8 05/19/2012 0409   ALBUMIN 3.0* 05/19/2012 0409   AST 58* 05/19/2012 0409   ALT 305* 05/19/2012 0409   ALKPHOS 166* 05/19/2012 0409   BILITOT 1.4* 05/19/2012 0409   GFRNONAA 85* 05/19/2012 0409   GFRAA >90 05/19/2012 0409   Lipase     Component Value Date/Time   LIPASE 34 05/16/2012 2315       Studies/Results: Nm Hepatobiliary  05/17/2012  *RADIOLOGY REPORT*  Clinical Data:  Elevated bilirubin.  NUCLEAR MEDICINE HEPATOBILIARY IMAGING  Technique:  Sequential images of the abdomen were obtained out to 60 minutes following intravenous  administration of radiopharmaceutical.  Radiopharmaceutical:  5.17mCi Tc-67m Choletec  Comparison:  I have CT 05/17/2012,  Findings: There is prompt extraction of radiotracer from the blood pool and homogeneous uptake within the liver.  There are no counts excreted into the common bile duct over the 95 minutes of imaging. No activity within the bowel.  The gallbladder is not filled.  The biliary tree is not identified readily.  IMPRESSION:  1.  No counts excreted into the biliary tree.  Legrand Rams this to represent  obstruction of the common bile duct or common hepatic duct.  Less likely differential would include acute hepatitis. 2.  Nonvisualization the gallbladder is likely secondary to obstruction of the common hepatic or  common bile duct.  Cannot evaluate patency of the cystic duct with this pattern.  Findings discussed Dr. Debroah Loop  on 05/18/2038 at 1600 hours   Original Report Authenticated By: Genevive Bi, M.D.    Dg C-arm 61-120 Min-no Report  05/18/2012  CLINICAL DATA: CBD Stones   C-ARM 61-120 MINUTES  Fluoroscopy was utilized by the requesting physician.  No radiographic  interpretation.      Anti-infectives: Anti-infectives   Start     Dose/Rate Route Frequency Ordered Stop   05/18/12 2100  ampicillin (OMNIPEN)  1 g in sodium chloride 0.9 % 50 mL IVPB     1 g 150 mL/hr over 20 Minutes Intravenous  Once 05/18/12 1802 05/18/12 2140   05/18/12 1600  ampicillin (OMNIPEN) 1 g in sodium chloride 0.9 % 50 mL IVPB     1 g 150 mL/hr over 20 Minutes Intravenous  Once 05/18/12 1442 05/18/12 1558       Assessment/Plan 1. Cholelithiasis with dilated CBD: HIDA support an obstructing stone, for MRCP poss ERCP today, will follow labs, once bilirubin normalizes more we will proceed with lap chole.   LOS: 3 days    WHITE, ELIZABETH 05/19/2012

## 2012-05-19 NOTE — Progress Notes (Signed)
[facility] PROGRESS NOTE  Harry Simmons XBJ:[id number] DOB: 02/26/1961 DOA: 05/16/2012 PCP: Adante Courington, MD   Brief narrative 52 year old male with history of asthma and depression presented with worsening epigastric pain for 1 day with transaminitis and HIDA scan and suggestive of cholelithiasis with an obstructing stone.   Assessment/Plan: Epigastric pain Secondary to cholelithiasis with obstructing stone seen on HIDA. ERCP done on 3/11 unsuccessful for cannulation of the CBD. MRCP done today shows intra-and extrahepatic biliary dietitian without a visible filling defect or irregularity. Recommends possibility of a recently passed stone causing stenosis at the ampulla. -Will discuss further plan with GI and surgery on repeating ERCP versus cholecystectomy. -Pain is better today and LFTs trending down. -Continue empiric antibiotic, PPI and pain control. -Patient continues to be n.p.o.  Code Status: Full code Family Communication: Wife at bedside  Disposition Plan: Home once stable   Consultants:  Dr. Elnoria Howard  Surgery  Procedures:  ERCP on 3/11  Antibiotics:  None  HPI/Subjective: He seen and examined today. Informs of some soreness in the epigastric area. Denies any nausea vomiting today  Objective: Filed Vitals:   05/19/12 0556 05/19/12 0946 05/19/12 1000 05/19/12 1430  BP: 113/72  123/77 152/92  Pulse:   55 57  Temp: 97.9 F (36.6 C)  97.9 F (36.6 C) 98.2 F (36.8 C)  TempSrc: Oral  Oral Oral  Resp: 20     Height:      Weight:      SpO2: 95% 97% 97% 98%    Intake/Output Summary (Last 24 hours) at 05/19/12 1817 Last data filed at 05/19/12 1400  Gross per 24 hour  Intake   1740 ml  Output   2175 ml  Net   -435 ml   Filed Weights   05/16/12 2007  Weight: 97.523 kg (215 lb)    Exam:   General:  Middle aged male in no acute distress  HEENT: No pallor, moist oral mucosa  Cardiovascular: Normal S1 and S2, no murmurs rub or  gallop  Respiratory: Clear to auscultation bilaterally, no added sounds   Abdomen: Soft, mild epigastric tenderness, nondistended, bowel sounds present  Musculoskeletal: Warm, no edema  CNS: AAO x3.  Data Reviewed: Basic Metabolic Panel:  Recent Labs Lab 05/16/12 2315 05/17/12 0945 05/18/12 0415 05/19/12 0409  NA 140 142 139 139  K 4.1 4.8 3.8 3.9  CL 102 107 104 105  CO2 27 26 25 24   GLUCOSE 109* 105* 96 88  BUN 14 10 10 10   CREATININE 0.95 0.93 1.00 1.01  CALCIUM 9.6 8.9 8.8 8.6  MG  --  2.1  --   --   PHOS  --  2.4  --   --    Liver Function Tests:  Recent Labs Lab 05/16/12 2315 05/17/12 0945 05/18/12 0415 05/19/12 0409  AST 487* 237* 128* 58*  ALT 833* 608* 455* 305*  ALKPHOS 160* 165* 172* 166*  BILITOT 1.9* 3.2* 4.0* 1.4*  PROT 8.6* 7.4 7.0 6.8  ALBUMIN 3.9 3.3* 3.2* 3.0*    Recent Labs Lab 05/16/12 2315  LIPASE 34   No results found for this basename: AMMONIA,  in the last 168 hours CBC:  Recent Labs Lab 05/16/12 2315 05/17/12 0945 05/18/12 0415  WBC 5.5 4.5 5.0  NEUTROABS 3.6 2.0  --   HGB 15.1 13.5 13.3  HCT 44.9 41.0 39.8  MCV 81.6 82.3 80.2  PLT 276 251 238   Cardiac Enzymes: No results found for this basename: CKTOTAL, CKMB,  CKMBINDEX, TROPONINI,  in the last 168 hours BNP (last 3 results) No results found for this basename: PROBNP,  in the last 8760 hours CBG:  Recent Labs Lab 2012/05/28 0747  GLUCAP 93    No results found for this or any previous visit (from the past 240 hour(s)).   Studies: Mr 3d Recon At Scanner  May 28, 2012  *RADIOLOGY REPORT*  Clinical Data:  Cholelithiasis with dilated common bile duct.  Lack of excretion of contrast into the common bile duct.  MRI ABDOMEN WITHOUT AND WITH CONTRAST (MRCP)  Technique:  Multiplanar multisequence MR imaging of the abdomen was performed without and with contrast, including heavily T2-weighted images of the biliary and pancreatic ducts.  Three-dimensional MR images were  rendered by post processing of the original MR data.  Contrast: 20mL MULTIHANCE GADOBENATE DIMEGLUMINE 529 MG/ML IV SOLN  Comparison:  Multiple exams, including 05/17/2012  Findings:  Two gallstones each measuring up to 2 cm are identified with gallbladder wall thickening and a small amount of pericholecystic fluid.  Mild intrahepatic bile duct dilatation is present along with mild common hepatic duct dilatation at 1 cm, and mild common bile duct dilatation and 0.6 cm.  The cystic duct attachment is somewhat low in position.  Dorsal pancreatic duct unremarkable.  MRCP does not reveal compelling findings of stricture or filling defect in the common bile duct on today's exam; there is smooth conical tapering of the duct shown on image 27 of series 13.  No obvious enhancing ampullary lesion.  The pancreas enhances normally.  A peripancreatic lymph node is mildly enlarged at 1.1 cm in short axis on image 39 of series 1501. No definite irregularity of the common bile duct is observed.  Two small lesions in segment 2 of the liver demonstrate initial peripheral marginal nodular enhancement with subsequent fill-in and delayed enhancement, characteristic of a small hemangioma.  No significant renal, splenic, or adrenal abnormality.  There is a suggestion of trace free pelvic fluid on the coronal images.  IMPRESSION:  1.  Mild intra- and extrahepatic biliary dilatation, with smooth conical tapering of the distal common bile duct in the vicinity of the ampulla, but without a visible filling defect or irregularity. The possibility of a recently passed stone causing stenosis at the ampulla is raised as a possibility given the lack of other explaining factors for the lack of bowel excretion and biliary dilatation.  Occult stone or occult stricture could likewise be considered. 2.  Gallbladder wall thickening with pericholecystic fluid and gallstones - correlate clinically in assessing for cholecystitis. 3.  There are too small  hemangioma is in segment two of the liver.  4.  Mildly enlarged peripancreatic lymph node, likely reactive. 5.  Incidentally, the attachment of the cystic duct to the common hepatic duct/common bile duct is somewhat low in position, along the superior margin of the pancreatic head.   Original Report Authenticated By: Gaylyn Rong, M.D.    Dg C-arm 61-120 Min-no Report  05/18/2012  CLINICAL DATA: CBD Stones   C-ARM 61-120 MINUTES  Fluoroscopy was utilized by the requesting physician.  No radiographic  interpretation.     Mr Abd W/wo Cm/mrcp  2012/05/28  *RADIOLOGY REPORT*  Clinical Data:  Cholelithiasis with dilated common bile duct.  Lack of excretion of contrast into the common bile duct.  MRI ABDOMEN WITHOUT AND WITH CONTRAST (MRCP)  Technique:  Multiplanar multisequence MR imaging of the abdomen was performed without and with contrast, including heavily T2-weighted images of the biliary and  pancreatic ducts.  Three-dimensional MR images were rendered by post processing of the original MR data.  Contrast: 20mL MULTIHANCE GADOBENATE DIMEGLUMINE 529 MG/ML IV SOLN  Comparison:  Multiple exams, including 05/17/2012  Findings:  Two gallstones each measuring up to 2 cm are identified with gallbladder wall thickening and a small amount of pericholecystic fluid.  Mild intrahepatic bile duct dilatation is present along with mild common hepatic duct dilatation at 1 cm, and mild common bile duct dilatation and 0.6 cm.  The cystic duct attachment is somewhat low in position.  Dorsal pancreatic duct unremarkable.  MRCP does not reveal compelling findings of stricture or filling defect in the common bile duct on today's exam; there is smooth conical tapering of the duct shown on image 27 of series 13.  No obvious enhancing ampullary lesion.  The pancreas enhances normally.  A peripancreatic lymph node is mildly enlarged at 1.1 cm in short axis on image 39 of series 1501. No definite irregularity of the common bile  duct is observed.  Two small lesions in segment 2 of the liver demonstrate initial peripheral marginal nodular enhancement with subsequent fill-in and delayed enhancement, characteristic of a small hemangioma.  No significant renal, splenic, or adrenal abnormality.  There is a suggestion of trace free pelvic fluid on the coronal images.  IMPRESSION:  1.  Mild intra- and extrahepatic biliary dilatation, with smooth conical tapering of the distal common bile duct in the vicinity of the ampulla, but without a visible filling defect or irregularity. The possibility of a recently passed stone causing stenosis at the ampulla is raised as a possibility given the lack of other explaining factors for the lack of bowel excretion and biliary dilatation.  Occult stone or occult stricture could likewise be considered. 2.  Gallbladder wall thickening with pericholecystic fluid and gallstones - correlate clinically in assessing for cholecystitis. 3.  There are too small hemangioma is in segment two of the liver.  4.  Mildly enlarged peripancreatic lymph node, likely reactive. 5.  Incidentally, the attachment of the cystic duct to the common hepatic duct/common bile duct is somewhat low in position, along the superior margin of the pancreatic head.   Original Report Authenticated By: Gaylyn Rong, M.D.     Scheduled Meds: . budesonide-formoterol  2 puff Inhalation BID  . pantoprazole  40 mg Oral Daily  . sertraline  100 mg Oral Daily   Continuous Infusions: . sodium chloride 75 mL/hr at 05/19/12 1710      Time spent: 25 minutes    DHUNGEL, NISHANT  [facility] Pager [phone] If 7PM-7AM, please contact night-coverage at [email or web address], password [facility] 05/19/2012, 6:17 PM  LOS: 3 days

## 2012-05-20 ENCOUNTER — Encounter (HOSPITAL_COMMUNITY): Payer: Self-pay | Admitting: *Deleted

## 2012-05-20 ENCOUNTER — Inpatient Hospital Stay (HOSPITAL_COMMUNITY): Payer: Federal, State, Local not specified - PPO | Admitting: Certified Registered"

## 2012-05-20 ENCOUNTER — Encounter (HOSPITAL_COMMUNITY): Payer: Self-pay | Admitting: Certified Registered"

## 2012-05-20 ENCOUNTER — Encounter (HOSPITAL_COMMUNITY)
Admission: EM | Disposition: A | Discharge status: Home / Self Care | Payer: Self-pay | Source: Home / Self Care | Attending: Internal Medicine

## 2012-05-20 ENCOUNTER — Inpatient Hospital Stay (HOSPITAL_COMMUNITY): Payer: Federal, State, Local not specified - PPO

## 2012-05-20 HISTORY — PX: CHOLECYSTECTOMY: SHX55

## 2012-05-20 LAB — GLUCOSE, CAPILLARY: Glucose-Capillary: 75 mg/dL (ref 70–99)

## 2012-05-20 LAB — SURGICAL PCR SCREEN: Staphylococcus aureus: NEGATIVE

## 2012-05-20 SURGERY — LAPAROSCOPIC CHOLECYSTECTOMY WITH INTRAOPERATIVE CHOLANGIOGRAM
Anesthesia: General | Site: Abdomen | Wound class: Contaminated

## 2012-05-20 MED ORDER — MIDAZOLAM HCL 5 MG/5ML IJ SOLN
INTRAMUSCULAR | Status: DC | PRN
  Administered 2012-05-20: 2 mg via INTRAVENOUS

## 2012-05-20 MED ORDER — OXYCODONE HCL 5 MG PO TABS
5.0000 mg | ORAL_TABLET | ORAL | Status: DC | PRN
  Administered 2012-05-21: 10 mg via ORAL
  Filled 2012-05-20 (×2): qty 2

## 2012-05-20 MED ORDER — GLYCOPYRROLATE 0.2 MG/ML IJ SOLN
INTRAMUSCULAR | Status: DC | PRN
  Administered 2012-05-20: .2 mg via INTRAVENOUS
  Administered 2012-05-20: .6 mg via INTRAVENOUS

## 2012-05-20 MED ORDER — FENTANYL CITRATE 0.05 MG/ML IJ SOLN
25.0000 ug | INTRAMUSCULAR | Status: DC | PRN
  Administered 2012-05-20 (×2): 25 ug via INTRAVENOUS

## 2012-05-20 MED ORDER — SUCCINYLCHOLINE CHLORIDE 20 MG/ML IJ SOLN
INTRAMUSCULAR | Status: DC | PRN
  Administered 2012-05-20: 100 mg via INTRAVENOUS

## 2012-05-20 MED ORDER — LACTATED RINGERS IV SOLN: INTRAVENOUS | Status: DC

## 2012-05-20 MED ORDER — 0.9 % SODIUM CHLORIDE (POUR BTL) OPTIME
TOPICAL | Status: DC | PRN
  Administered 2012-05-20: 1000 mL

## 2012-05-20 MED ORDER — LIDOCAINE HCL (CARDIAC) 20 MG/ML IV SOLN
INTRAVENOUS | Status: DC | PRN
  Administered 2012-05-20: 80 mg via INTRAVENOUS

## 2012-05-20 MED ORDER — FENTANYL CITRATE 0.05 MG/ML IJ SOLN
INTRAMUSCULAR | Status: DC | PRN
  Administered 2012-05-20: 50 ug via INTRAVENOUS
  Administered 2012-05-20: 100 ug via INTRAVENOUS
  Administered 2012-05-20: 50 ug via INTRAVENOUS
  Administered 2012-05-20: 150 ug via INTRAVENOUS

## 2012-05-20 MED ORDER — CISATRACURIUM BESYLATE (PF) 10 MG/5ML IV SOLN
INTRAVENOUS | Status: DC | PRN
  Administered 2012-05-20 (×2): 2 mg via INTRAVENOUS
  Administered 2012-05-20: 6 mg via INTRAVENOUS

## 2012-05-20 MED ORDER — IOHEXOL 300 MG/ML  SOLN
INTRAMUSCULAR | Status: DC | PRN
  Administered 2012-05-20: 12 mL via INTRAVENOUS

## 2012-05-20 MED ORDER — PROPOFOL 10 MG/ML IV BOLUS
INTRAVENOUS | Status: DC | PRN
  Administered 2012-05-20: 200 mg via INTRAVENOUS

## 2012-05-20 MED ORDER — LACTATED RINGERS IV SOLN
INTRAVENOUS | Status: DC | PRN
  Administered 2012-05-20: 1000 mL

## 2012-05-20 MED ORDER — HYDROMORPHONE HCL PF 1 MG/ML IJ SOLN
0.5000 mg | INTRAMUSCULAR | Status: DC | PRN
  Administered 2012-05-21: 1 mg via INTRAVENOUS
  Filled 2012-05-20: qty 1

## 2012-05-20 MED ORDER — LACTATED RINGERS IV SOLN
INTRAVENOUS | Status: DC | PRN
  Administered 2012-05-20: 10:00:00 via INTRAVENOUS

## 2012-05-20 MED ORDER — BUPIVACAINE-EPINEPHRINE 0.25% -1:200000 IJ SOLN
INTRAMUSCULAR | Status: DC | PRN
  Administered 2012-05-20: 30 mg

## 2012-05-20 MED ORDER — NEOSTIGMINE METHYLSULFATE 1 MG/ML IJ SOLN
INTRAMUSCULAR | Status: DC | PRN
  Administered 2012-05-20: 3 mg via INTRAVENOUS
  Administered 2012-05-20: 1 mg via INTRAVENOUS

## 2012-05-20 SURGICAL SUPPLY — 37 items
ADH SKN CLS APL DERMABOND .7 (GAUZE/BANDAGES/DRESSINGS) ×1
APPLIER CLIP ROT 10 11.4 M/L (STAPLE) ×2
APR CLP MED LRG 11.4X10 (STAPLE) ×1
BAG SPEC RTRVL LRG 6X4 10 (ENDOMECHANICALS)
CANISTER SUCTION 2500CC (MISCELLANEOUS) ×2 IMPLANT
CATH REDDICK CHOLANGI 4FR 50CM (CATHETERS) ×2 IMPLANT
CLIP APPLIE ROT 10 11.4 M/L (STAPLE) ×1 IMPLANT
CLOTH BEACON ORANGE TIMEOUT ST (SAFETY) ×2 IMPLANT
COVER MAYO STAND STRL (DRAPES) ×2 IMPLANT
DECANTER SPIKE VIAL GLASS SM (MISCELLANEOUS) ×2 IMPLANT
DERMABOND ADVANCED (GAUZE/BANDAGES/DRESSINGS) ×1
DERMABOND ADVANCED .7 DNX12 (GAUZE/BANDAGES/DRESSINGS) ×1 IMPLANT
DRAPE C-ARM 42X72 X-RAY (DRAPES) ×2 IMPLANT
DRAPE LAPAROSCOPIC ABDOMINAL (DRAPES) ×2 IMPLANT
DRAPE UTILITY XL STRL (DRAPES) ×2 IMPLANT
ELECT REM PT RETURN 9FT ADLT (ELECTROSURGICAL) ×2
ELECTRODE REM PT RTRN 9FT ADLT (ELECTROSURGICAL) ×1 IMPLANT
GLOVE BIO SURGEON STRL SZ7.5 (GLOVE) ×4 IMPLANT
GLOVE BIOGEL PI IND STRL 7.0 (GLOVE) ×1 IMPLANT
GLOVE BIOGEL PI INDICATOR 7.0 (GLOVE) ×1
GOWN PREVENTION PLUS XLARGE (GOWN DISPOSABLE) ×2 IMPLANT
GOWN STRL NON-REIN LRG LVL3 (GOWN DISPOSABLE) ×2 IMPLANT
GOWN STRL REIN XL XLG (GOWN DISPOSABLE) ×2 IMPLANT
HEMOSTAT SURGICEL 4X8 (HEMOSTASIS) ×2 IMPLANT
IV CATH 14GX2 1/4 (CATHETERS) ×2 IMPLANT
KIT BASIN OR (CUSTOM PROCEDURE TRAY) ×2 IMPLANT
POUCH SPECIMEN RETRIEVAL 10MM (ENDOMECHANICALS) IMPLANT
SCISSORS ENDO CVD 5DCS (MISCELLANEOUS) ×1 IMPLANT
SET IRRIG TUBING LAPAROSCOPIC (IRRIGATION / IRRIGATOR) ×2 IMPLANT
SOLUTION ANTI FOG 6CC (MISCELLANEOUS) ×2 IMPLANT
SUT MNCRL AB 4-0 PS2 18 (SUTURE) ×2 IMPLANT
TOWEL OR 17X26 10 PK STRL BLUE (TOWEL DISPOSABLE) ×6 IMPLANT
TRAY LAP CHOLE (CUSTOM PROCEDURE TRAY) ×2 IMPLANT
TROCAR BLADELESS OPT 5 75 (ENDOMECHANICALS) ×4 IMPLANT
TROCAR XCEL BLUNT TIP 100MML (ENDOMECHANICALS) ×2 IMPLANT
TROCAR XCEL NON-BLD 11X100MML (ENDOMECHANICALS) ×2 IMPLANT
TUBING INSUFFLATION 10FT LAP (TUBING) ×2 IMPLANT

## 2012-05-20 NOTE — Op Note (Signed)
05/16/2012 - 05/20/2012  12:26 PM  PATIENT:  Harry Simmons  52 y.o. male  PRE-OPERATIVE DIAGNOSIS:  cholelithiasis  POST-OPERATIVE DIAGNOSIS:  cholelithiasis  PROCEDURE:  Procedure(s): LAPAROSCOPIC CHOLECYSTECTOMY WITH INTRAOPERATIVE CHOLANGIOGRAM (N/A)  SURGEON:  Surgeon(s) and Role:    * Robyne Askew, MD - Primary    * Romie Levee, MD - Assisting  PHYSICIAN ASSISTANT:   ASSISTANTS: Dr. Maisie Fus  ANESTHESIA:   general  EBL:  Total I/O In: -  Out: 200 [Urine:200]  BLOOD ADMINISTERED:none  DRAINS: none   LOCAL MEDICATIONS USED:  MARCAINE     SPECIMEN:  Source of Specimen:  gallbladder  DISPOSITION OF SPECIMEN:  PATHOLOGY  COUNTS:  YES  TOURNIQUET:  * No tourniquets in log *  DICTATION: .Dragon Dictation  Procedure: After informed consent was obtained the patient was brought to the operating room and placed in the supine position on the operating room table. After adequate induction of general anesthesia the patient's abdomen was prepped with ChloraPrep allowed to dry and draped in usual sterile manner. The area below the umbilicus was infiltrated with quarter percent  Marcaine. A small incision was made with a 15 blade knife. The incision was carried down through the subcutaneous tissue bluntly with a hemostat and [employer] retractors. The linea alba was identified. The linea alba was incised with a 15 blade knife and each side was grasped with Coker clamps. The preperitoneal space was then probed with a hemostat until the peritoneum was opened and access was gained to the abdominal cavity. A 0 Vicryl pursestring stitch was placed in the fascia surrounding the opening. A Hassan cannula was then placed through the opening and anchored in place with the previously placed Vicryl purse string stitch. The abdomen was insufflated with carbon dioxide without difficulty. A laparoscope was inserted through the [facility] cannula in the right upper quadrant was inspected. Next the  epigastric region was infiltrated with % Marcaine. A small incision was made with a 15 blade knife. A 10 mm port was placed bluntly through this incision into the abdominal cavity under direct vision. Next 2 sites were chosen laterally on the right side of the abdomen for placement of 5 mm ports. Each of these areas was infiltrated with quarter percent Marcaine. Small stab incisions were made with a 15 blade knife. 5 mm ports were then placed bluntly through these incisions into the abdominal cavity under direct vision without difficulty. A blunt grasper was placed through the lateralmost 5 mm port and used to grasp the dome of the gallbladder and elevated anteriorly and superiorly. Another blunt grasper was placed through the other 5 mm port and used to retract the body and neck of the gallbladder. A dissector was placed through the epigastric port and using the electrocautery the peritoneal reflection at the gallbladder neck was opened. Blunt dissection was then carried out in this area until the gallbladder neck-cystic duct junction was readily identified and a good window was created. A single clip was placed on the gallbladder neck. A small  ductotomy was made just below the clip with laparoscopic scissors. A 14-gauge Angiocath was then placed through the anterior abdominal wall under direct vision. A Reddick cholangiogram catheter was then placed through the Angiocath and flushed. The catheter was then placed in the cystic duct and anchored in place with a clip. A cholangiogram was obtained that showed no filling defects good emptying into the duodenum an adequate length on the cystic duct. The anchoring clip and catheters were  then removed from the patient. 3 clips were placed proximally on the cystic duct and the duct was divided between the 2 sets of clips. Posterior to this the cystic artery was identified and again dissected bluntly in a circumferential manner until a good window  was created. 2 clips  were placed proximally and one distally on the artery and the artery was divided between the 2 sets of clips. Next a laparoscopic hook cautery device was used to separate the gallbladder from the liver bed. Prior to completely detaching the gallbladder from the liver bed the liver bed was inspected and several small bleeding points were coagulated with the electrocautery until the area was completely hemostatic. The gallbladder was then detached the rest of it from the liver bed without difficulty. A laparoscopic bag was inserted through the epigastric port. The gallbladder was placed within the bag and the bag was sealed. A laparoscope was then moved to the epigastric port. The gallbladder grasper was placed through the [facility] cannula and used to grasp the opening of the bag. The bag with the gallbladder was then removed with the [facility] cannula through the infraumbilical port without difficulty. The fascial defect was then closed with the previously placed Vicryl pursestring stitch as well as with another figure-of-eight 0 Vicryl stitch. The liver bed was inspected again and found to be hemostatic. The abdomen was irrigated with copious amounts of saline until the effluent was clear. The ports were then removed under direct vision without difficulty and were found to be hemostatic. The gas was allowed to escape. The skin incisions were all closed with interrupted 4-0 Monocryl subcuticular stitches. Dermabond dressings were applied. The patient tolerated the procedure well. At the end of the case all needle sponge and instrument counts were correct. The patient was then awakened and taken to recovery in stable condition  PLAN OF CARE: Admit to inpatient   PATIENT DISPOSITION:  PACU - hemodynamically stable.   Delay start of Pharmacological VTE agent (>24hrs) due to surgical blood loss or risk of bleeding: yes

## 2012-05-20 NOTE — Interval H&P Note (Signed)
History and Physical Interval Note:  05/20/2012 9:13 AM  Harry Simmons  has presented today for surgery, with the diagnosis of cholelithiasi  The various methods of treatment have been discussed with the patient and family. After consideration of risks, benefits and other options for treatment, the patient has consented to  Procedure(s): LAPAROSCOPIC CHOLECYSTECTOMY WITH INTRAOPERATIVE CHOLANGIOGRAM (N/A) as a surgical intervention .  The patient's history has been reviewed, patient examined, no change in status, stable for surgery.  I have reviewed the patient's chart and labs.  Questions were answered to the patient's satisfaction.     TOTH III,PAUL S

## 2012-05-20 NOTE — Progress Notes (Signed)
Patient c/o poor pain control from Dilaudid 1mg  IV every 4 hours prn pain. Dr. Abbey Chatters return page with t.o.v. new prn pain meds. Patient informed.

## 2012-05-20 NOTE — Preoperative (Signed)
Beta Blockers   Reason not to administer Beta Blockers:Not Applicable 

## 2012-05-20 NOTE — ED Provider Notes (Signed)
Medical screening examination/treatment/procedure(s) were conducted as a shared visit with non-physician practitioner(s) and myself.  I personally evaluated the patient during the encounter Jones Skene, M.D.  Harry Simmons is a 52 y.o. male presents with acute on chrnonic epigastric and RUQ pain that started most recently last night, it does not radiate, aching, severe - this has improved with morphine, it was associated with nausea and vomiting which have improved with zofran.  Denies fevers, chills, itching, rash, melena, hematochexzia, chest pain, shortness of breath, confusion, headache.  VITAL SIGNS:   Filed Vitals:   05/20/12 0558  BP: 130/85  Pulse: 53  Temp: 97.9 F (36.6 C)  Resp: 16   CONSTITUTIONAL: Awake, oriented, appears non-toxic HENT: Atraumatic, normocephalic, oral mucosa pink and moist, airway patent. Nares patent without drainage. External ears normal. EYES: Conjunctiva clear, EOMI, PERRLA NECK: Trachea midline, non-tender, supple CARDIOVASCULAR: Normal heart rate, Normal rhythm, No murmurs, rubs, gallops PULMONARY/CHEST: Clear to auscultation, no rhonchi, wheezes, or rales. Symmetrical breath sounds. Non-tender. ABDOMINAL: Non-distended, soft, TTP in the epigastrium and RUQ- no rebound or guarding.  BS normal. NEUROLOGIC: Non-focal, moving all four extremities, no gross sensory or motor deficits. EXTREMITIES: No clubbing, cyanosis, or edema SKIN: Warm, Dry, No erythema, No rash  MDM: Reviewed vitals signs, PA notes, nursing notes and lab results. Reviewed CT scan personally.  PT admitted for likely choledocholithiasis.  D/W [facility], d/w [facility] for admission.   Jones Skene, MD 05/20/12 [phone]

## 2012-05-20 NOTE — Anesthesia Postprocedure Evaluation (Signed)
  Anesthesia Post-op Note  Patient: Harry Simmons  Procedure(s) Performed: Procedure(s) (LRB): LAPAROSCOPIC CHOLECYSTECTOMY WITH INTRAOPERATIVE CHOLANGIOGRAM (N/A)  Patient Location: PACU  Anesthesia Type: General  Level of Consciousness: awake and alert   Airway and Oxygen Therapy: Patient Spontanous Breathing  Post-op Pain: mild  Post-op Assessment: Post-op Vital signs reviewed, Patient's Cardiovascular Status Stable, Respiratory Function Stable, Patent Airway and No signs of Nausea or vomiting  Last Vitals:  Filed Vitals:   05/20/12 1318  BP:   Pulse: 70  Temp: 36.4 C  Resp: 12    Post-op Vital Signs: stable   Complications: No apparent anesthesia complications

## 2012-05-20 NOTE — Progress Notes (Signed)
[facility] PROGRESS NOTE  Harry Simmons ZOX:[id number] DOB: Oct 08, 1960 DOA: 05/16/2012 PCP: Zani Kyllonen, MD  Brief narrative  52 year old male with history of asthma and depression presented with worsening epigastric pain for 1 day with transaminitis and HIDA scan and suggestive of cholelithiasis with an obstructing stone.   Assessment/Plan:  Epigastric pain  Secondary to cholelithiasis with obstructing stone seen on HIDA. ERCP done on 3/11 unsuccessful for cannulation of the CBD. MRCP done shows intra-and extrahepatic biliary dilatation without a visible filling defect or irregularity. Recommends possibility of a recently passed stone causing stenosis at the ampulla.  -Discussed with surgery and vision taken to OR for laparoscopy with intraoperative cholangiogram today. -Pain much improved today  and LFTs trending down.  -Continue  PPI and pain control.  .  Code Status: Full code  Family Communication: Sister at bedside  Disposition Plan: Home once stable   Consultants:  Dr. Elnoria Howard  Surgery  Procedures:  ERCP on 3/11 Laparoscopic cholecystectomy with IOC on 3/13 Antibiotics:  None HPI/Subjective:  Patient seen and examined this morning. Epigastric pain much improved.   Objective: Filed Vitals:   05/19/12 2152 05/20/12 0558 05/20/12 0800 05/20/12 0925  BP: 139/86 130/85 148/81   Pulse: 56 53 53   Temp: 97.9 F (36.6 C) 97.9 F (36.6 C) 98.3 F (36.8 C)   TempSrc: Oral Oral Oral   Resp: 18 16 18    Height:      Weight:      SpO2: 95% 97% 98% 98%    Intake/Output Summary (Last 24 hours) at 05/20/12 1152 Last data filed at 05/20/12 1000  Gross per 24 hour  Intake   1500 ml  Output   2225 ml  Net   -725 ml   Filed Weights   05/16/12 2007  Weight: 97.523 kg (215 lb)    Exam:  General: Middle aged male in no acute distress  HEENT: No pallor, moist oral mucosa  Cardiovascular: Normal S1 and S2, no murmurs rub or gallop  Respiratory: Clear to  auscultation bilaterally, no added sounds  Abdomen: Soft, minimal epigastric tenderness, nondistended, bowel sounds present  Musculoskeletal: Warm, no edema  CNS: AAO x3.   Data Reviewed: Basic Metabolic Panel:  Recent Labs Lab 05/16/12 2315 05/17/12 0945 05/18/12 0415 05/19/12 0409 05/19/12 1845  NA 140 142 139 139 137  K 4.1 4.8 3.8 3.9 4.1  CL 102 107 104 105 101  CO2 27 26 25 24 26   GLUCOSE 109* 105* 96 88 82  BUN 14 10 10 10 10   CREATININE 0.95 0.93 1.00 1.01 0.98  CALCIUM 9.6 8.9 8.8 8.6 9.2  MG  --  2.1  --   --   --   PHOS  --  2.4  --   --   --    Liver Function Tests:  Recent Labs Lab 05/16/12 2315 05/17/12 0945 05/18/12 0415 05/19/12 0409 05/19/12 1845  AST 487* 237* 128* 58* 47*  ALT 833* 608* 455* 305* 277*  ALKPHOS 160* 165* 172* 166* 181*  BILITOT 1.9* 3.2* 4.0* 1.4* 1.2  PROT 8.6* 7.4 7.0 6.8 7.9  ALBUMIN 3.9 3.3* 3.2* 3.0* 3.4*    Recent Labs Lab 05/16/12 2315  LIPASE 34   No results found for this basename: AMMONIA,  in the last 168 hours CBC:  Recent Labs Lab 05/16/12 2315 05/17/12 0945 05/18/12 0415  WBC 5.5 4.5 5.0  NEUTROABS 3.6 2.0  --   HGB 15.1 13.5 13.3  HCT 44.9 41.0 39.8  MCV 81.6 82.3 80.2  PLT 276 251 238   Cardiac Enzymes: No results found for this basename: CKTOTAL, CKMB, CKMBINDEX, TROPONINI,  in the last 168 hours BNP (last 3 results) No results found for this basename: PROBNP,  in the last 8760 hours CBG:  Recent Labs Lab Jun 13, 2012 0747  GLUCAP 93    Recent Results (from the past 240 hour(s))  SURGICAL PCR SCREEN     Status: None   Collection Time    05/20/12  8:18 AM      Result Value Range Status   MRSA, PCR NEGATIVE  NEGATIVE Final   Staphylococcus aureus NEGATIVE  NEGATIVE Final   Comment:            The Xpert SA Assay (FDA     approved for NASAL specimens     in patients over 54 years of age),     is one component of     a comprehensive surveillance     program.  Test performance has      been validated by [employer] for patients greater     than or equal to 59 year old.     It is not intended     to diagnose infection nor to     guide or monitor treatment.     Studies: Mr 3d Recon At Scanner  06/13/2012  *RADIOLOGY REPORT*  Clinical Data:  Cholelithiasis with dilated common bile duct.  Lack of excretion of contrast into the common bile duct.  MRI ABDOMEN WITHOUT AND WITH CONTRAST (MRCP)  Technique:  Multiplanar multisequence MR imaging of the abdomen was performed without and with contrast, including heavily T2-weighted images of the biliary and pancreatic ducts.  Three-dimensional MR images were rendered by post processing of the original MR data.  Contrast: 20mL MULTIHANCE GADOBENATE DIMEGLUMINE 529 MG/ML IV SOLN  Comparison:  Multiple exams, including 05/17/2012  Findings:  Two gallstones each measuring up to 2 cm are identified with gallbladder wall thickening and a small amount of pericholecystic fluid.  Mild intrahepatic bile duct dilatation is present along with mild common hepatic duct dilatation at 1 cm, and mild common bile duct dilatation and 0.6 cm.  The cystic duct attachment is somewhat low in position.  Dorsal pancreatic duct unremarkable.  MRCP does not reveal compelling findings of stricture or filling defect in the common bile duct on today's exam; there is smooth conical tapering of the duct shown on image 27 of series 13.  No obvious enhancing ampullary lesion.  The pancreas enhances normally.  A peripancreatic lymph node is mildly enlarged at 1.1 cm in short axis on image 39 of series 1501. No definite irregularity of the common bile duct is observed.  Two small lesions in segment 2 of the liver demonstrate initial peripheral marginal nodular enhancement with subsequent fill-in and delayed enhancement, characteristic of a small hemangioma.  No significant renal, splenic, or adrenal abnormality.  There is a suggestion of trace free pelvic fluid on the coronal  images.  IMPRESSION:  1.  Mild intra- and extrahepatic biliary dilatation, with smooth conical tapering of the distal common bile duct in the vicinity of the ampulla, but without a visible filling defect or irregularity. The possibility of a recently passed stone causing stenosis at the ampulla is raised as a possibility given the lack of other explaining factors for the lack of bowel excretion and biliary dilatation.  Occult stone or occult stricture could likewise be considered. 2.  Gallbladder wall thickening with pericholecystic fluid and gallstones - correlate clinically in assessing for cholecystitis. 3.  There are too small hemangioma is in segment two of the liver.  4.  Mildly enlarged peripancreatic lymph node, likely reactive. 5.  Incidentally, the attachment of the cystic duct to the common hepatic duct/common bile duct is somewhat low in position, along the superior margin of the pancreatic head.   Original Report Authenticated By: Gaylyn Rong, M.D.    Dg C-arm 61-120 Min-no Report  05/18/2012  CLINICAL DATA: CBD Stones   C-ARM 61-120 MINUTES  Fluoroscopy was utilized by the requesting physician.  No radiographic  interpretation.     Mr Abd W/wo Cm/mrcp  05/19/2012  *RADIOLOGY REPORT*  Clinical Data:  Cholelithiasis with dilated common bile duct.  Lack of excretion of contrast into the common bile duct.  MRI ABDOMEN WITHOUT AND WITH CONTRAST (MRCP)  Technique:  Multiplanar multisequence MR imaging of the abdomen was performed without and with contrast, including heavily T2-weighted images of the biliary and pancreatic ducts.  Three-dimensional MR images were rendered by post processing of the original MR data.  Contrast: 20mL MULTIHANCE GADOBENATE DIMEGLUMINE 529 MG/ML IV SOLN  Comparison:  Multiple exams, including 05/17/2012  Findings:  Two gallstones each measuring up to 2 cm are identified with gallbladder wall thickening and a small amount of pericholecystic fluid.  Mild intrahepatic  bile duct dilatation is present along with mild common hepatic duct dilatation at 1 cm, and mild common bile duct dilatation and 0.6 cm.  The cystic duct attachment is somewhat low in position.  Dorsal pancreatic duct unremarkable.  MRCP does not reveal compelling findings of stricture or filling defect in the common bile duct on today's exam; there is smooth conical tapering of the duct shown on image 27 of series 13.  No obvious enhancing ampullary lesion.  The pancreas enhances normally.  A peripancreatic lymph node is mildly enlarged at 1.1 cm in short axis on image 39 of series 1501. No definite irregularity of the common bile duct is observed.  Two small lesions in segment 2 of the liver demonstrate initial peripheral marginal nodular enhancement with subsequent fill-in and delayed enhancement, characteristic of a small hemangioma.  No significant renal, splenic, or adrenal abnormality.  There is a suggestion of trace free pelvic fluid on the coronal images.  IMPRESSION:  1.  Mild intra- and extrahepatic biliary dilatation, with smooth conical tapering of the distal common bile duct in the vicinity of the ampulla, but without a visible filling defect or irregularity. The possibility of a recently passed stone causing stenosis at the ampulla is raised as a possibility given the lack of other explaining factors for the lack of bowel excretion and biliary dilatation.  Occult stone or occult stricture could likewise be considered. 2.  Gallbladder wall thickening with pericholecystic fluid and gallstones - correlate clinically in assessing for cholecystitis. 3.  There are too small hemangioma is in segment two of the liver.  4.  Mildly enlarged peripancreatic lymph node, likely reactive. 5.  Incidentally, the attachment of the cystic duct to the common hepatic duct/common bile duct is somewhat low in position, along the superior margin of the pancreatic head.   Original Report Authenticated By: Gaylyn Rong,  M.D.     Scheduled Meds: . [MAR HOLD] budesonide-formoterol  2 puff Inhalation BID  . [MAR HOLD] pantoprazole  40 mg Oral Daily  . Inov8 Surgical HOLD] sertraline  100 mg Oral Daily   Continuous Infusions: . sodium  chloride 75 mL/hr at 05/20/12 0611  . lactated ringers        Time spent: 25 minutes    DHUNGEL, NISHANT  [facility] Pager [phone] If 7PM-7AM, please contact night-coverage at [email or web address], password [facility] 05/20/2012, 11:52 AM  LOS: 4 days

## 2012-05-20 NOTE — Anesthesia Preprocedure Evaluation (Addendum)
Anesthesia Evaluation  Patient identified by MRN, date of birth, ID band Patient awake    Reviewed: Allergy & Precautions, H&P , NPO status , Patient's Chart, lab work & pertinent test results  Airway Mallampati: II TM Distance: >3 FB Neck ROM: full    Dental no notable dental hx. (+) Teeth Intact and Dental Advisory Given   Pulmonary asthma ,  Exercise asthma breath sounds clear to auscultation  Pulmonary exam normal       Cardiovascular Exercise Tolerance: Good negative cardio ROS  Rhythm:regular Rate:Normal     Neuro/Psych negative neurological ROS  negative psych ROS   GI/Hepatic negative GI ROS, Neg liver ROS, GERD-  Medicated and Controlled,  Endo/Other  negative endocrine ROS  Renal/GU negative Renal ROS  negative genitourinary   Musculoskeletal   Abdominal   Peds  Hematology negative hematology ROS (+)   Anesthesia Other Findings   Reproductive/Obstetrics negative OB ROS                          Anesthesia Physical Anesthesia Plan  ASA: II  Anesthesia Plan: General   Post-op Pain Management:    Induction: Intravenous  Airway Management Planned: Oral ETT  Additional Equipment:   Intra-op Plan:   Post-operative Plan: Extubation in OR  Informed Consent: I have reviewed the patients History and Physical, chart, labs and discussed the procedure including the risks, benefits and alternatives for the proposed anesthesia with the patient or authorized representative who has indicated his/her understanding and acceptance.   Dental Advisory Given  Plan Discussed with: CRNA and Surgeon  Anesthesia Plan Comments:         Anesthesia Gong Evaluation

## 2012-05-20 NOTE — Progress Notes (Signed)
2 Days Post-Op  Subjective: No complaints  Objective: Vital signs in last 24 hours: Temp:  [97.8 F (36.6 C)-98.3 F (36.8 C)] 98.3 F (36.8 C) (03/13 0800) Pulse Rate:  [53-57] 53 (03/13 0800) Resp:  [16-18] 18 (03/13 0800) BP: (123-152)/(77-92) 148/81 mmHg (03/13 0800) SpO2:  [95 %-98 %] 98 % (03/13 0800) Last BM Date: 05/16/12  Intake/Output from previous day: 03/12 0701 - 03/13 0700 In: 1800 [I.V.:1800] Out: 2250 [Urine:2250] Intake/Output this shift:    GI: soft, mild RUQ tenderness  Lab Results:   Recent Labs  05/17/12 0945 05/18/12 0415  WBC 4.5 5.0  HGB 13.5 13.3  HCT 41.0 39.8  PLT 251 238   BMET  Recent Labs  05/19/12 0409 05/19/12 1845  NA 139 137  K 3.9 4.1  CL 105 101  CO2 24 26  GLUCOSE 88 82  BUN 10 10  CREATININE 1.01 0.98  CALCIUM 8.6 9.2   PT/INR  Recent Labs  05/17/12 0945  LABPROT 14.0  INR 1.09   ABG No results found for this basename: PHART, PCO2, PO2, HCO3,  in the last 72 hours  Studies/Results: Mr 3d Recon At Scanner  05/19/2012  *RADIOLOGY REPORT*  Clinical Data:  Cholelithiasis with dilated common bile duct.  Lack of excretion of contrast into the common bile duct.  MRI ABDOMEN WITHOUT AND WITH CONTRAST (MRCP)  Technique:  Multiplanar multisequence MR imaging of the abdomen was performed without and with contrast, including heavily T2-weighted images of the biliary and pancreatic ducts.  Three-dimensional MR images were rendered by post processing of the original MR data.  Contrast: 20mL MULTIHANCE GADOBENATE DIMEGLUMINE 529 MG/ML IV SOLN  Comparison:  Multiple exams, including 05/17/2012  Findings:  Two gallstones each measuring up to 2 cm are identified with gallbladder wall thickening and a small amount of pericholecystic fluid.  Mild intrahepatic bile duct dilatation is present along with mild common hepatic duct dilatation at 1 cm, and mild common bile duct dilatation and 0.6 cm.  The cystic duct attachment is somewhat  low in position.  Dorsal pancreatic duct unremarkable.  MRCP does not reveal compelling findings of stricture or filling defect in the common bile duct on today's exam; there is smooth conical tapering of the duct shown on image 27 of series 13.  No obvious enhancing ampullary lesion.  The pancreas enhances normally.  A peripancreatic lymph node is mildly enlarged at 1.1 cm in short axis on image 39 of series 1501. No definite irregularity of the common bile duct is observed.  Two small lesions in segment 2 of the liver demonstrate initial peripheral marginal nodular enhancement with subsequent fill-in and delayed enhancement, characteristic of a small hemangioma.  No significant renal, splenic, or adrenal abnormality.  There is a suggestion of trace free pelvic fluid on the coronal images.  IMPRESSION:  1.  Mild intra- and extrahepatic biliary dilatation, with smooth conical tapering of the distal common bile duct in the vicinity of the ampulla, but without a visible filling defect or irregularity. The possibility of a recently passed stone causing stenosis at the ampulla is raised as a possibility given the lack of other explaining factors for the lack of bowel excretion and biliary dilatation.  Occult stone or occult stricture could likewise be considered. 2.  Gallbladder wall thickening with pericholecystic fluid and gallstones - correlate clinically in assessing for cholecystitis. 3.  There are too small hemangioma is in segment two of the liver.  4.  Mildly enlarged peripancreatic lymph  node, likely reactive. 5.  Incidentally, the attachment of the cystic duct to the common hepatic duct/common bile duct is somewhat low in position, along the superior margin of the pancreatic head.   Original Report Authenticated By: Gaylyn Rong, M.D.    Dg C-arm 61-120 Min-no Report  05/18/2012  CLINICAL DATA: CBD Stones   C-ARM 61-120 MINUTES  Fluoroscopy was utilized by the requesting physician.  No radiographic   interpretation.     Mr Abd W/wo Cm/mrcp  05/19/2012  *RADIOLOGY REPORT*  Clinical Data:  Cholelithiasis with dilated common bile duct.  Lack of excretion of contrast into the common bile duct.  MRI ABDOMEN WITHOUT AND WITH CONTRAST (MRCP)  Technique:  Multiplanar multisequence MR imaging of the abdomen was performed without and with contrast, including heavily T2-weighted images of the biliary and pancreatic ducts.  Three-dimensional MR images were rendered by post processing of the original MR data.  Contrast: 20mL MULTIHANCE GADOBENATE DIMEGLUMINE 529 MG/ML IV SOLN  Comparison:  Multiple exams, including 05/17/2012  Findings:  Two gallstones each measuring up to 2 cm are identified with gallbladder wall thickening and a small amount of pericholecystic fluid.  Mild intrahepatic bile duct dilatation is present along with mild common hepatic duct dilatation at 1 cm, and mild common bile duct dilatation and 0.6 cm.  The cystic duct attachment is somewhat low in position.  Dorsal pancreatic duct unremarkable.  MRCP does not reveal compelling findings of stricture or filling defect in the common bile duct on today's exam; there is smooth conical tapering of the duct shown on image 27 of series 13.  No obvious enhancing ampullary lesion.  The pancreas enhances normally.  A peripancreatic lymph node is mildly enlarged at 1.1 cm in short axis on image 39 of series 1501. No definite irregularity of the common bile duct is observed.  Two small lesions in segment 2 of the liver demonstrate initial peripheral marginal nodular enhancement with subsequent fill-in and delayed enhancement, characteristic of a small hemangioma.  No significant renal, splenic, or adrenal abnormality.  There is a suggestion of trace free pelvic fluid on the coronal images.  IMPRESSION:  1.  Mild intra- and extrahepatic biliary dilatation, with smooth conical tapering of the distal common bile duct in the vicinity of the ampulla, but without a  visible filling defect or irregularity. The possibility of a recently passed stone causing stenosis at the ampulla is raised as a possibility given the lack of other explaining factors for the lack of bowel excretion and biliary dilatation.  Occult stone or occult stricture could likewise be considered. 2.  Gallbladder wall thickening with pericholecystic fluid and gallstones - correlate clinically in assessing for cholecystitis. 3.  There are too small hemangioma is in segment two of the liver.  4.  Mildly enlarged peripancreatic lymph node, likely reactive. 5.  Incidentally, the attachment of the cystic duct to the common hepatic duct/common bile duct is somewhat low in position, along the superior margin of the pancreatic head.   Original Report Authenticated By: Gaylyn Rong, M.D.     Anti-infectives: Anti-infectives   Start     Dose/Rate Route Frequency Ordered Stop   05/18/12 2100  ampicillin (OMNIPEN) 1 g in sodium chloride 0.9 % 50 mL IVPB     1 g 150 mL/hr over 20 Minutes Intravenous  Once 05/18/12 1802 05/18/12 2140   05/18/12 1600  ampicillin (OMNIPEN) 1 g in sodium chloride 0.9 % 50 mL IVPB     1 g 150  mL/hr over 20 Minutes Intravenous  Once 05/18/12 1442 05/18/12 1558      Assessment/Plan: s/p Procedure(s): ENDOSCOPIC RETROGRADE CHOLANGIOPANCREATOGRAPHY (ERCP) (N/A) Plan for lap chole with ioc today Risks and benefits of surgery discussed with pt including some of the technical aspects and he understands and wishes to proceed  LOS: 4 days    TOTH III,PAUL S 05/20/2012

## 2012-05-20 NOTE — Progress Notes (Signed)
Subjective: Feeling well.  No abdominal pain.  Objective: Vital signs in last 24 hours: Temp:  [97.8 F (36.6 C)-98.2 F (36.8 C)] 97.9 F (36.6 C) (03/13 0558) Pulse Rate:  [53-57] 53 (03/13 0558) Resp:  [16-18] 16 (03/13 0558) BP: (123-152)/(77-92) 130/85 mmHg (03/13 0558) SpO2:  [95 %-98 %] 97 % (03/13 0558) Last BM Date: 05/16/12  Intake/Output from previous day: 03/12 0701 - 03/13 0700 In: 1800 [I.V.:1800] Out: 2250 [Urine:2250] Intake/Output this shift:    General appearance: alert and no distress GI: soft, non-tender; bowel sounds normal; no masses,  no organomegaly  Lab Results:  Recent Labs  05/17/12 0945 05/18/12 0415  WBC 4.5 5.0  HGB 13.5 13.3  HCT 41.0 39.8  PLT 251 238   BMET  Recent Labs  05/18/12 0415 05/19/12 0409 05/19/12 1845  NA 139 139 137  K 3.8 3.9 4.1  CL 104 105 101  CO2 25 24 26   GLUCOSE 96 88 82  BUN 10 10 10   CREATININE 1.00 1.01 0.98  CALCIUM 8.8 8.6 9.2   LFT  Recent Labs  05/19/12 1845  PROT 7.9  ALBUMIN 3.4*  AST 47*  ALT 277*  ALKPHOS 181*  BILITOT 1.2   PT/INR  Recent Labs  05/17/12 0945  LABPROT 14.0  INR 1.09   Hepatitis Panel No results found for this basename: HEPBSAG, HCVAB, HEPAIGM, HEPBIGM,  in the last 72 hours C-Diff No results found for this basename: CDIFFTOX,  in the last 72 hours Fecal Lactopherrin No results found for this basename: FECLLACTOFRN,  in the last 72 hours  Studies/Results: Mr 3d Recon At Scanner  05/19/2012  *RADIOLOGY REPORT*  Clinical Data:  Cholelithiasis with dilated common bile duct.  Lack of excretion of contrast into the common bile duct.  MRI ABDOMEN WITHOUT AND WITH CONTRAST (MRCP)  Technique:  Multiplanar multisequence MR imaging of the abdomen was performed without and with contrast, including heavily T2-weighted images of the biliary and pancreatic ducts.  Three-dimensional MR images were rendered by post processing of the original MR data.  Contrast: 20mL  MULTIHANCE GADOBENATE DIMEGLUMINE 529 MG/ML IV SOLN  Comparison:  Multiple exams, including 05/17/2012  Findings:  Two gallstones each measuring up to 2 cm are identified with gallbladder wall thickening and a small amount of pericholecystic fluid.  Mild intrahepatic bile duct dilatation is present along with mild common hepatic duct dilatation at 1 cm, and mild common bile duct dilatation and 0.6 cm.  The cystic duct attachment is somewhat low in position.  Dorsal pancreatic duct unremarkable.  MRCP does not reveal compelling findings of stricture or filling defect in the common bile duct on today's exam; there is smooth conical tapering of the duct shown on image 27 of series 13.  No obvious enhancing ampullary lesion.  The pancreas enhances normally.  A peripancreatic lymph node is mildly enlarged at 1.1 cm in short axis on image 39 of series 1501. No definite irregularity of the common bile duct is observed.  Two small lesions in segment 2 of the liver demonstrate initial peripheral marginal nodular enhancement with subsequent fill-in and delayed enhancement, characteristic of a small hemangioma.  No significant renal, splenic, or adrenal abnormality.  There is a suggestion of trace free pelvic fluid on the coronal images.  IMPRESSION:  1.  Mild intra- and extrahepatic biliary dilatation, with smooth conical tapering of the distal common bile duct in the vicinity of the ampulla, but without a visible filling defect or irregularity. The possibility of  a recently passed stone causing stenosis at the ampulla is raised as a possibility given the lack of other explaining factors for the lack of bowel excretion and biliary dilatation.  Occult stone or occult stricture could likewise be considered. 2.  Gallbladder wall thickening with pericholecystic fluid and gallstones - correlate clinically in assessing for cholecystitis. 3.  There are too small hemangioma is in segment two of the liver.  4.  Mildly enlarged  peripancreatic lymph node, likely reactive. 5.  Incidentally, the attachment of the cystic duct to the common hepatic duct/common bile duct is somewhat low in position, along the superior margin of the pancreatic head.   Original Report Authenticated By: Gaylyn Rong, M.D.    Dg C-arm 61-120 Min-no Report  05/18/2012  CLINICAL DATA: CBD Stones   C-ARM 61-120 MINUTES  Fluoroscopy was utilized by the requesting physician.  No radiographic  interpretation.     Mr Abd W/wo Cm/mrcp  05/19/2012  *RADIOLOGY REPORT*  Clinical Data:  Cholelithiasis with dilated common bile duct.  Lack of excretion of contrast into the common bile duct.  MRI ABDOMEN WITHOUT AND WITH CONTRAST (MRCP)  Technique:  Multiplanar multisequence MR imaging of the abdomen was performed without and with contrast, including heavily T2-weighted images of the biliary and pancreatic ducts.  Three-dimensional MR images were rendered by post processing of the original MR data.  Contrast: 20mL MULTIHANCE GADOBENATE DIMEGLUMINE 529 MG/ML IV SOLN  Comparison:  Multiple exams, including 05/17/2012  Findings:  Two gallstones each measuring up to 2 cm are identified with gallbladder wall thickening and a small amount of pericholecystic fluid.  Mild intrahepatic bile duct dilatation is present along with mild common hepatic duct dilatation at 1 cm, and mild common bile duct dilatation and 0.6 cm.  The cystic duct attachment is somewhat low in position.  Dorsal pancreatic duct unremarkable.  MRCP does not reveal compelling findings of stricture or filling defect in the common bile duct on today's exam; there is smooth conical tapering of the duct shown on image 27 of series 13.  No obvious enhancing ampullary lesion.  The pancreas enhances normally.  A peripancreatic lymph node is mildly enlarged at 1.1 cm in short axis on image 39 of series 1501. No definite irregularity of the common bile duct is observed.  Two small lesions in segment 2 of the liver  demonstrate initial peripheral marginal nodular enhancement with subsequent fill-in and delayed enhancement, characteristic of a small hemangioma.  No significant renal, splenic, or adrenal abnormality.  There is a suggestion of trace free pelvic fluid on the coronal images.  IMPRESSION:  1.  Mild intra- and extrahepatic biliary dilatation, with smooth conical tapering of the distal common bile duct in the vicinity of the ampulla, but without a visible filling defect or irregularity. The possibility of a recently passed stone causing stenosis at the ampulla is raised as a possibility given the lack of other explaining factors for the lack of bowel excretion and biliary dilatation.  Occult stone or occult stricture could likewise be considered. 2.  Gallbladder wall thickening with pericholecystic fluid and gallstones - correlate clinically in assessing for cholecystitis. 3.  There are too small hemangioma is in segment two of the liver.  4.  Mildly enlarged peripancreatic lymph node, likely reactive. 5.  Incidentally, the attachment of the cystic duct to the common hepatic duct/common bile duct is somewhat low in position, along the superior margin of the pancreatic head.   Original Report Authenticated By: Gaylyn Rong,  M.D.     Medications:  Scheduled: . budesonide-formoterol  2 puff Inhalation BID  . pantoprazole  40 mg Oral Daily  . sertraline  100 mg Oral Daily   Continuous: . sodium chloride 75 mL/hr at 05/20/12 4098    Assessment/Plan: 1) ? Choledocholithiasis. 2) Cholelithiasis.   The patient is well clinically.  No abdominal pain complaints at this time.  The MRCP is negative for any overt stones.  A conical tapering at the ampulla was noted, but this is not pathologic.  It was noted on the ERCP as a large floppy ampulla, which was the reason for the failed cannulation during the ERCP.  Blood work is pending this AM.  Plan: 1) Lap chole per surgery.  I would recommend an IOC during  the surgery even though he already had the MRCP. 2) Monitor liver panel.   LOS: 4 days   Altair Appenzeller D 05/20/2012, 7:40 AM

## 2012-05-20 NOTE — H&P (View-Only) (Signed)
2 Days Post-Op  Subjective: No complaints  Objective: Vital signs in last 24 hours: Temp:  [97.8 F (36.6 C)-98.3 F (36.8 C)] 98.3 F (36.8 C) (03/13 0800) Pulse Rate:  [53-57] 53 (03/13 0800) Resp:  [16-18] 18 (03/13 0800) BP: (123-152)/(77-92) 148/81 mmHg (03/13 0800) SpO2:  [95 %-98 %] 98 % (03/13 0800) Last BM Date: 05/16/12  Intake/Output from previous day: 03/12 0701 - 03/13 0700 In: 1800 [I.V.:1800] Out: 2250 [Urine:2250] Intake/Output this shift:    GI: soft, mild RUQ tenderness  Lab Results:   Recent Labs  05/17/12 0945 05/18/12 0415  WBC 4.5 5.0  HGB 13.5 13.3  HCT 41.0 39.8  PLT 251 238   BMET  Recent Labs  05/19/12 0409 05/19/12 1845  NA 139 137  K 3.9 4.1  CL 105 101  CO2 24 26  GLUCOSE 88 82  BUN 10 10  CREATININE 1.01 0.98  CALCIUM 8.6 9.2   PT/INR  Recent Labs  05/17/12 0945  LABPROT 14.0  INR 1.09   ABG No results found for this basename: PHART, PCO2, PO2, HCO3,  in the last 72 hours  Studies/Results: Mr 3d Recon At Scanner  05/19/2012  *RADIOLOGY REPORT*  Clinical Data:  Cholelithiasis with dilated common bile duct.  Lack of excretion of contrast into the common bile duct.  MRI ABDOMEN WITHOUT AND WITH CONTRAST (MRCP)  Technique:  Multiplanar multisequence MR imaging of the abdomen was performed without and with contrast, including heavily T2-weighted images of the biliary and pancreatic ducts.  Three-dimensional MR images were rendered by post processing of the original MR data.  Contrast: 20mL MULTIHANCE GADOBENATE DIMEGLUMINE 529 MG/ML IV SOLN  Comparison:  Multiple exams, including 05/17/2012  Findings:  Two gallstones each measuring up to 2 cm are identified with gallbladder wall thickening and a small amount of pericholecystic fluid.  Mild intrahepatic bile duct dilatation is present along with mild common hepatic duct dilatation at 1 cm, and mild common bile duct dilatation and 0.6 cm.  The cystic duct attachment is somewhat  low in position.  Dorsal pancreatic duct unremarkable.  MRCP does not reveal compelling findings of stricture or filling defect in the common bile duct on today's exam; there is smooth conical tapering of the duct shown on image 27 of series 13.  No obvious enhancing ampullary lesion.  The pancreas enhances normally.  A peripancreatic lymph node is mildly enlarged at 1.1 cm in short axis on image 39 of series 1501. No definite irregularity of the common bile duct is observed.  Two small lesions in segment 2 of the liver demonstrate initial peripheral marginal nodular enhancement with subsequent fill-in and delayed enhancement, characteristic of a small hemangioma.  No significant renal, splenic, or adrenal abnormality.  There is a suggestion of trace free pelvic fluid on the coronal images.  IMPRESSION:  1.  Mild intra- and extrahepatic biliary dilatation, with smooth conical tapering of the distal common bile duct in the vicinity of the ampulla, but without a visible filling defect or irregularity. The possibility of a recently passed stone causing stenosis at the ampulla is raised as a possibility given the lack of other explaining factors for the lack of bowel excretion and biliary dilatation.  Occult stone or occult stricture could likewise be considered. 2.  Gallbladder wall thickening with pericholecystic fluid and gallstones - correlate clinically in assessing for cholecystitis. 3.  There are too small hemangioma is in segment two of the liver.  4.  Mildly enlarged peripancreatic lymph   node, likely reactive. 5.  Incidentally, the attachment of the cystic duct to the common hepatic duct/common bile duct is somewhat low in position, along the superior margin of the pancreatic head.   Original Report Authenticated By: Walter Liebkemann, M.D.    Dg C-arm 61-120 Min-no Report  05/18/2012  CLINICAL DATA: CBD Stones   C-ARM 61-120 MINUTES  Fluoroscopy was utilized by the requesting physician.  No radiographic   interpretation.     Mr Abd W/wo Cm/mrcp  05/19/2012  *RADIOLOGY REPORT*  Clinical Data:  Cholelithiasis with dilated common bile duct.  Lack of excretion of contrast into the common bile duct.  MRI ABDOMEN WITHOUT AND WITH CONTRAST (MRCP)  Technique:  Multiplanar multisequence MR imaging of the abdomen was performed without and with contrast, including heavily T2-weighted images of the biliary and pancreatic ducts.  Three-dimensional MR images were rendered by post processing of the original MR data.  Contrast: 20mL MULTIHANCE GADOBENATE DIMEGLUMINE 529 MG/ML IV SOLN  Comparison:  Multiple exams, including 05/17/2012  Findings:  Two gallstones each measuring up to 2 cm are identified with gallbladder wall thickening and a small amount of pericholecystic fluid.  Mild intrahepatic bile duct dilatation is present along with mild common hepatic duct dilatation at 1 cm, and mild common bile duct dilatation and 0.6 cm.  The cystic duct attachment is somewhat low in position.  Dorsal pancreatic duct unremarkable.  MRCP does not reveal compelling findings of stricture or filling defect in the common bile duct on today's exam; there is smooth conical tapering of the duct shown on image 27 of series 13.  No obvious enhancing ampullary lesion.  The pancreas enhances normally.  A peripancreatic lymph node is mildly enlarged at 1.1 cm in short axis on image 39 of series 1501. No definite irregularity of the common bile duct is observed.  Two small lesions in segment 2 of the liver demonstrate initial peripheral marginal nodular enhancement with subsequent fill-in and delayed enhancement, characteristic of a small hemangioma.  No significant renal, splenic, or adrenal abnormality.  There is a suggestion of trace free pelvic fluid on the coronal images.  IMPRESSION:  1.  Mild intra- and extrahepatic biliary dilatation, with smooth conical tapering of the distal common bile duct in the vicinity of the ampulla, but without a  visible filling defect or irregularity. The possibility of a recently passed stone causing stenosis at the ampulla is raised as a possibility given the lack of other explaining factors for the lack of bowel excretion and biliary dilatation.  Occult stone or occult stricture could likewise be considered. 2.  Gallbladder wall thickening with pericholecystic fluid and gallstones - correlate clinically in assessing for cholecystitis. 3.  There are too small hemangioma is in segment two of the liver.  4.  Mildly enlarged peripancreatic lymph node, likely reactive. 5.  Incidentally, the attachment of the cystic duct to the common hepatic duct/common bile duct is somewhat low in position, along the superior margin of the pancreatic head.   Original Report Authenticated By: Walter Liebkemann, M.D.     Anti-infectives: Anti-infectives   Start     Dose/Rate Route Frequency Ordered Stop   05/18/12 2100  ampicillin (OMNIPEN) 1 g in sodium chloride 0.9 % 50 mL IVPB     1 g 150 mL/hr over 20 Minutes Intravenous  Once 05/18/12 1802 05/18/12 2140   05/18/12 1600  ampicillin (OMNIPEN) 1 g in sodium chloride 0.9 % 50 mL IVPB     1 g 150   mL/hr over 20 Minutes Intravenous  Once 05/18/12 1442 05/18/12 1558      Assessment/Plan: s/p Procedure(s): ENDOSCOPIC RETROGRADE CHOLANGIOPANCREATOGRAPHY (ERCP) (N/A) Plan for lap chole with ioc today Risks and benefits of surgery discussed with pt including some of the technical aspects and he understands and wishes to proceed  LOS: 4 days    TOTH III,PAUL S 05/20/2012  

## 2012-05-20 NOTE — Transfer of Care (Signed)
Immediate Anesthesia Transfer of Care Note  Patient: Harry Simmons  Procedure(s) Performed: Procedure(s): LAPAROSCOPIC CHOLECYSTECTOMY WITH INTRAOPERATIVE CHOLANGIOGRAM (N/A)  Patient Location: PACU  Anesthesia Type:General  Level of Consciousness: awake, alert , oriented, patient cooperative and responds to stimulation  Airway & Oxygen Therapy: Patient Spontanous Breathing and Patient connected to face mask oxygen  Post-op Assessment: Report given to PACU RN, Post -op Vital signs reviewed and stable and Patient moving all extremities  Post vital signs: Reviewed and stable  Complications: No apparent anesthesia complications

## 2012-05-21 ENCOUNTER — Encounter (HOSPITAL_COMMUNITY): Payer: Self-pay | Admitting: General Surgery

## 2012-05-21 DIAGNOSIS — K8050 Calculus of bile duct without cholangitis or cholecystitis without obstruction: Secondary | ICD-10-CM

## 2012-05-21 DIAGNOSIS — K8042 Calculus of bile duct with acute cholecystitis without obstruction: Secondary | ICD-10-CM | POA: Diagnosis present

## 2012-05-21 LAB — COMPREHENSIVE METABOLIC PANEL
Alkaline Phosphatase: 193 U/L — ABNORMAL HIGH (ref 39–117)
BUN: 9 mg/dL (ref 6–23)
Chloride: 103 mEq/L (ref 96–112)
GFR calc Af Amer: >90 mL/min (ref >90)
GFR calc non Af Amer: >90 mL/min (ref >90)
Glucose, Bld: 91 mg/dL (ref 70–99)
Potassium: 4 mEq/L (ref 3.5–5.1)
Total Bilirubin: 0.9 mg/dL (ref 0.3–1.2)

## 2012-05-21 LAB — CBC
Platelets: 268 10*3/uL (ref 150–400)
RDW: 14.9 % (ref 11.5–15.5)
WBC: 7 10*3/uL (ref 4.0–10.5)

## 2012-05-21 MED ORDER — OXYCODONE HCL 5 MG PO TABS: 5.0000 mg | ORAL_TABLET | ORAL | Status: DC | PRN

## 2012-05-21 MED FILL — Cefazolin Sodium for IV Soln 2 GM and Dextrose 3% (50 ML): INTRAVENOUS | Qty: 50 | Status: AC

## 2012-05-21 NOTE — Discharge Instructions (Signed)
[facility] ______[facility] °LAPAROSCOPIC SURGERY: POST OP INSTRUCTIONS °Always review your discharge instruction sheet given to you by the facility where your surgery was performed. °IF YOU HAVE DISABILITY OR FAMILY LEAVE FORMS, YOU MUST BRING THEM TO THE OFFICE FOR PROCESSING.   °DO NOT GIVE THEM TO YOUR DOCTOR. ° °1. A prescription for pain medication may be given to you upon discharge.  Take your pain medication as prescribed, if needed.  If narcotic pain medicine is not needed, then you may take acetaminophen (Tylenol) or ibuprofen (Advil) as needed. °2. Take your usually prescribed medications unless otherwise directed. °3. If you need a refill on your pain medication, please contact your pharmacy.  They will contact our office to request authorization. Prescriptions will not be filled after 5pm or on week-ends. °4. You should follow a light diet the first few days after arrival home, such as soup and crackers, etc.  Be sure to include lots of fluids daily. °5. Most patients will experience some swelling and bruising in the area of the incisions.  Ice packs will help.  Swelling and bruising can take several days to resolve.  °6. It is common to experience some constipation if taking pain medication after surgery.  Increasing fluid intake and taking a stool softener (such as Colace) will usually help or prevent this problem from occurring.  A mild laxative (Milk of Magnesia or Miralax) should be taken according to package instructions if there are no bowel movements after 48 hours. °7. Unless discharge instructions indicate otherwise, you may remove your bandages 24-48 hours after surgery, and you may shower at that time.  You may have steri-strips (small skin tapes) in place directly over the incision.  These strips should be left on the skin for 7-10 days.  If your surgeon used skin glue on the incision, you may shower in 24 hours.  The glue will flake off over the next 2-3 weeks.  Any sutures or  staples will be removed at the office during your follow-up visit. °8. ACTIVITIES:  You may resume regular (light) daily activities beginning the next day--such as daily self-care, walking, climbing stairs--gradually increasing activities as tolerated.  You may have sexual intercourse when it is comfortable.  Refrain from any heavy lifting or straining until approved by your doctor. °a. You may drive when you are no longer taking prescription pain medication, you can comfortably wear a seatbelt, and you can safely maneuver your car and apply brakes. °b. RETURN TO WORK:  __________________________________________________________ °9. You should see your doctor in the office for a follow-up appointment approximately 2-3 weeks after your surgery.  Make sure that you call for this appointment within a day or two after you arrive home to insure a convenient appointment time. °10. OTHER INSTRUCTIONS: __________________________________________________________________________________________________________________________ __________________________________________________________________________________________________________________________ °WHEN TO CALL YOUR DOCTOR: °1. Fever over 101.0 °2. Inability to urinate °3. Continued bleeding from incision. °4. Increased pain, redness, or drainage from the incision. °5. Increasing abdominal pain ° °The clinic staff is available to answer your questions during regular business hours.  Please don’t hesitate to call and ask to speak to one of the nurses for clinical concerns.  If you have a medical emergency, go to the nearest emergency room or call 911.  A surgeon from [facility] is always on call at the hospital. °[address], [address], [place], [place]  [address] ? [address] [address], [place], [place]   [address] °[phone] ? [phone] ? FAX [phone] °Web site:   [email or web address] °

## 2012-05-21 NOTE — Discharge Summary (Signed)
Physician Discharge Summary  GENARO BEKKER ZHY:[id number] DOB: November 21, 1960 DOA: 05/16/2012  PCP: Default, Provider, MD  Admit date: 05/16/2012 Discharge date: 05/21/2012  Time spent: 40 minutes  Recommendations for Outpatient Follow-up:  1. Home with outpatient surgery followup in 2 weeks  Discharge Diagnoses:    Principal Problem:   Choledocholithiasis and cholelithiasis with acute cholecystitis     Discharge Condition: Fair  Diet recommendation: Regular  Filed Weights   05/16/12 2007  Weight: 97.523 kg (215 lb)    History of present illness:  Please refer to admission H&P for detail but in brief  52 year old male with history of asthma and depression presented with worsening epigastric pain for 1 day with transaminitis and HIDA scan and suggestive of cholelithiasis with an obstructing stone.    Hospital Course:  Epigastric pain secondary to cholelithiasis and choledocholithiasis -obstructing stone seen on HIDA.  -ERCP done on 3/11 unsuccessful for cannulation of the CBD. MRCP done shows intra-and extrahepatic biliary dilatation without a visible filling defect or irregularity. Recommends possibility of a recently passed stone causing stenosis at the ampulla.  -Patient taken to OR for laparoscopy with intraoperative cholangiogram done. IOC without any filling defects. -She tolerated surgery well and his LFTs are trending down. Does not have any further pain. He has tolerated regular diet and can be discharged home with outpatient surgery followup.   Discharge Exam: Filed Vitals:   05/20/12 1803 05/20/12 2107 05/20/12 2215 05/21/12 0555  BP: 138/86  126/75 120/76  Pulse: 56  58 58  Temp: 98.1 F (36.7 C)  98.1 F (36.7 C) 98.2 F (36.8 C)  TempSrc: Oral  Oral Oral  Resp: 20  18 18   Height:      Weight:      SpO2: 98% 99% 98% 99%    General: Middle aged male in no acute distress  HEENT: No pallor, moist oral mucosa  Cardiovascular: Normal S1 and S2, no murmurs  rub or gallop  Respiratory: Clear to auscultation bilaterally, no added sounds  Abdomen: Soft, laparoscopy site clean, no epigastric or right upper quadrant , tenderness nondistended, bowel sounds present  Musculoskeletal: Warm, no edema  CNS: AAO x3.   Discharge Instructions     Medication List    TAKE these medications       albuterol 108 (90 BASE) MCG/ACT inhaler  Commonly known as:  PROVENTIL HFA;VENTOLIN HFA  Inhale 2 puffs into the lungs every 6 (six) hours as needed.     budesonide-formoterol 160-4.5 MCG/ACT inhaler  Commonly known as:  SYMBICORT  Inhale 2 puffs into the lungs 2 (two) times daily.     loratadine 10 MG tablet  Commonly known as:  CLARITIN  Take 10 mg by mouth daily as needed. For allergies     mirtazapine 15 MG tablet  Commonly known as:  REMERON  Take 15 mg by mouth at bedtime as needed. For sleep     omeprazole 20 MG capsule  Commonly known as:  PRILOSEC  Take 20 mg by mouth daily.     oxyCODONE 5 MG immediate release tablet  Commonly known as:  Oxy IR/ROXICODONE  Take 1-2 tablets (5-10 mg total) by mouth every 4 (four) hours as needed.     sertraline 100 MG tablet  Commonly known as:  ZOLOFT  Take 100 mg by mouth daily.     traMADol 50 MG tablet  Commonly known as:  ULTRAM  Take 50 mg by mouth every 6 (six) hours as needed for pain.  Follow-up Information   Follow up with [facility] In 2 weeks. (Our office will contact you with your appt day and time)    Contact information:   [address]   [place] [address] [phone]        The results of significant diagnostics from this hospitalization (including imaging, microbiology, ancillary and laboratory) are listed below for reference.    Significant Diagnostic Studies: Dg Cholangiogram Operative  05/20/2012  *RADIOLOGY REPORT*  Clinical Data:   Abdominal pain, laparoscopic cholecystectomy  INTRAOPERATIVE CHOLANGIOGRAM  Technique:   Cholangiographic images from the C-arm fluoroscopic device were submitted for interpretation post-operatively.  Please see the procedural report for the amount of contrast and the fluoroscopy time utilized.  Comparison:  None  Findings:  No persistent filling defects in the common duct. Intrahepatic ducts are incompletely visualized, appearing decompressed centrally. Contrast passes into the duodenum.  IMPRESSION  Negative for retained common duct stone.   Original Report Authenticated By: D. Andria Rhein, MD    Nm Hepatobiliary  05/17/2012  *RADIOLOGY REPORT*  Clinical Data:  Elevated bilirubin.  NUCLEAR MEDICINE HEPATOBILIARY IMAGING  Technique:  Sequential images of the abdomen were obtained out to 60 minutes following intravenous administration of radiopharmaceutical.  Radiopharmaceutical:  5.28mCi Tc-47m Choletec  Comparison:  I have CT 05/17/2012,  Findings: There is prompt extraction of radiotracer from the blood pool and homogeneous uptake within the liver.  There are no counts excreted into the common bile duct over the 95 minutes of imaging. No activity within the bowel.  The gallbladder is not filled.  The biliary tree is not identified readily.  IMPRESSION:  1.  No counts excreted into the biliary tree.  Legrand Rams this to represent  obstruction of the common bile duct or common hepatic duct.  Less likely differential would include acute hepatitis. 2.  Nonvisualization the gallbladder is likely secondary to obstruction of the common hepatic or  common bile duct.  Cannot evaluate patency of the cystic duct with this pattern.  Findings discussed Dr. Debroah Loop  on 05/18/2038 at 1600 hours   Original Report Authenticated By: Genevive Bi, M.D.    US Abdomen Complete  05/17/2012  *RADIOLOGY REPORT*  Clinical Data:  Elevated LFTs, abdominal pain  COMPLETE ABDOMINAL ULTRASOUND  Comparison:  None.  Findings:  Gallbladder:  Gallstones.  No gallbladder wall thickening or pericholecystic fluid. Negative sonographic  Murphy's sign.  Common bile duct:  Prominent at 9 mm proximally.  The distal duct is obscured by overlying bowel gas artifact/limited acoustic windows.  Liver:  Diffusely increased liver echogenicity. There is a 2 cm hypoechoic area adjacent to the gallbladder fossa which may reflect fatty sparing.  IVC:  Appears normal.  Pancreas:  Poorly visualized.  Spleen:  Measures 8.4 cm oblique.  No focal abnormality.  Right Kidney:  Measures 10.3 cm.  No hydronephrosis or focal abnormality.  Left Kidney:  Measures 12.1 cm.  No hydronephrosis or focal abnormality.  Abdominal aorta:  No aneurysm identified.  IMPRESSION: Cholelithiasis without sonographic evidence for acute cholecystitis.  Nonspecific dilatationof of the proximal CBD. The distal CBD is obscured. Correlate with LFTs and ERCP if warranted.  Hepatic steatosis.  Hypoechoic area along the gallbladder fossa may reflect an area of fatty sparing.  This can be reevaluated with a 6- month ultrasound follow-up or non emergent MRI.   Original Report Authenticated By: Jearld Lesch, M.D.    Ct Abdomen Pelvis W Contrast  05/17/2012  *RADIOLOGY REPORT*  Clinical Data:  Lower abdominal pain  CT ABDOMEN AND PELVIS WITH CONTRAST  Technique:  Multidetector CT imaging of the abdomen and pelvis was performed using the standard protocol following bolus administration of intravenous contrast.  Contrast: OMNIPAQUE IOHEXOL 300 MG/ML  SOLN 100 ml Omnipaque- 300  Comparison:  05/17/2012 ultrasound  Findings:  Limited images through the lung bases demonstrate no significant appreciable abnormality. The heart size is within normal limits. No pleural or pericardial effusion.  Hepatic steatosis.  Unremarkable spleen, pancreas, adrenal glands.  Gallstones.  No gallbladder wall thickening or pericholecystic fluid. Minimal prominence of the CBD at 7 mm, tapering along the course smoothly to the level of the ampulla.  Symmetric renal enhancement.  No hydronephrosis or hydroureter  No  CT evidence for colitis.  Normal appendix.  Small bowel loops are normal course and caliber.  Nonspecific gastric distension.  No free intraperitoneal air or fluid.  Tiny umbilical hernia contains fat.  No lymphadenopathy.  Normal caliber aorta and branch vessels.  Decompressed bladder.  No acute osseous finding. L5 pars defects.  IMPRESSION:  Hepatic steatosis.  Gallstones and nonspecific mild wall thickening.  Correlate with HIDA scan if clinical concern for acute cholecystitis persists.   Original Report Authenticated By: Jearld Lesch, M.D.    Mr 3d Recon At Scanner  05/19/2012  *RADIOLOGY REPORT*  Clinical Data:  Cholelithiasis with dilated common bile duct.  Lack of excretion of contrast into the common bile duct.  MRI ABDOMEN WITHOUT AND WITH CONTRAST (MRCP)  Technique:  Multiplanar multisequence MR imaging of the abdomen was performed without and with contrast, including heavily T2-weighted images of the biliary and pancreatic ducts.  Three-dimensional MR images were rendered by post processing of the original MR data.  Contrast: 20mL MULTIHANCE GADOBENATE DIMEGLUMINE 529 MG/ML IV SOLN  Comparison:  Multiple exams, including 05/17/2012  Findings:  Two gallstones each measuring up to 2 cm are identified with gallbladder wall thickening and a small amount of pericholecystic fluid.  Mild intrahepatic bile duct dilatation is present along with mild common hepatic duct dilatation at 1 cm, and mild common bile duct dilatation and 0.6 cm.  The cystic duct attachment is somewhat low in position.  Dorsal pancreatic duct unremarkable.  MRCP does not reveal compelling findings of stricture or filling defect in the common bile duct on today's exam; there is smooth conical tapering of the duct shown on image 27 of series 13.  No obvious enhancing ampullary lesion.  The pancreas enhances normally.  A peripancreatic lymph node is mildly enlarged at 1.1 cm in short axis on image 39 of series 1501. No definite  irregularity of the common bile duct is observed.  Two small lesions in segment 2 of the liver demonstrate initial peripheral marginal nodular enhancement with subsequent fill-in and delayed enhancement, characteristic of a small hemangioma.  No significant renal, splenic, or adrenal abnormality.  There is a suggestion of trace free pelvic fluid on the coronal images.  IMPRESSION:  1.  Mild intra- and extrahepatic biliary dilatation, with smooth conical tapering of the distal common bile duct in the vicinity of the ampulla, but without a visible filling defect or irregularity. The possibility of a recently passed stone causing stenosis at the ampulla is raised as a possibility given the lack of other explaining factors for the lack of bowel excretion and biliary dilatation.  Occult stone or occult stricture could likewise be considered. 2.  Gallbladder wall thickening with pericholecystic fluid and gallstones - correlate clinically  in assessing for cholecystitis. 3.  There are too small hemangioma is in segment two of the liver.  4.  Mildly enlarged peripancreatic lymph node, likely reactive. 5.  Incidentally, the attachment of the cystic duct to the common hepatic duct/common bile duct is somewhat low in position, along the superior margin of the pancreatic head.   Original Report Authenticated By: Gaylyn Rong, M.D.    Dg C-arm 61-120 Min-no Report  05/18/2012  CLINICAL DATA: CBD Stones   C-ARM 61-120 MINUTES  Fluoroscopy was utilized by the requesting physician.  No radiographic  interpretation.     Mr Abd W/wo Cm/mrcp  05/19/2012  *RADIOLOGY REPORT*  Clinical Data:  Cholelithiasis with dilated common bile duct.  Lack of excretion of contrast into the common bile duct.  MRI ABDOMEN WITHOUT AND WITH CONTRAST (MRCP)  Technique:  Multiplanar multisequence MR imaging of the abdomen was performed without and with contrast, including heavily T2-weighted images of the biliary and pancreatic ducts.   Three-dimensional MR images were rendered by post processing of the original MR data.  Contrast: 20mL MULTIHANCE GADOBENATE DIMEGLUMINE 529 MG/ML IV SOLN  Comparison:  Multiple exams, including 05/17/2012  Findings:  Two gallstones each measuring up to 2 cm are identified with gallbladder wall thickening and a small amount of pericholecystic fluid.  Mild intrahepatic bile duct dilatation is present along with mild common hepatic duct dilatation at 1 cm, and mild common bile duct dilatation and 0.6 cm.  The cystic duct attachment is somewhat low in position.  Dorsal pancreatic duct unremarkable.  MRCP does not reveal compelling findings of stricture or filling defect in the common bile duct on today's exam; there is smooth conical tapering of the duct shown on image 27 of series 13.  No obvious enhancing ampullary lesion.  The pancreas enhances normally.  A peripancreatic lymph node is mildly enlarged at 1.1 cm in short axis on image 39 of series 1501. No definite irregularity of the common bile duct is observed.  Two small lesions in segment 2 of the liver demonstrate initial peripheral marginal nodular enhancement with subsequent fill-in and delayed enhancement, characteristic of a small hemangioma.  No significant renal, splenic, or adrenal abnormality.  There is a suggestion of trace free pelvic fluid on the coronal images.  IMPRESSION:  1.  Mild intra- and extrahepatic biliary dilatation, with smooth conical tapering of the distal common bile duct in the vicinity of the ampulla, but without a visible filling defect or irregularity. The possibility of a recently passed stone causing stenosis at the ampulla is raised as a possibility given the lack of other explaining factors for the lack of bowel excretion and biliary dilatation.  Occult stone or occult stricture could likewise be considered. 2.  Gallbladder wall thickening with pericholecystic fluid and gallstones - correlate clinically in assessing for  cholecystitis. 3.  There are too small hemangioma is in segment two of the liver.  4.  Mildly enlarged peripancreatic lymph node, likely reactive. 5.  Incidentally, the attachment of the cystic duct to the common hepatic duct/common bile duct is somewhat low in position, along the superior margin of the pancreatic head.   Original Report Authenticated By: Gaylyn Rong, M.D.     Microbiology: Recent Results (from the past 240 hour(s))  SURGICAL PCR SCREEN     Status: None   Collection Time    05/20/12  8:18 AM      Result Value Range Status   MRSA, PCR NEGATIVE  NEGATIVE Final  Staphylococcus aureus NEGATIVE  NEGATIVE Final   Comment:            The Xpert SA Assay (FDA     approved for NASAL specimens     in patients over 7 years of age),     is one component of     a comprehensive surveillance     program.  Test performance has     been validated by [employer] for patients greater     than or equal to 41 year old.     It is not intended     to diagnose infection nor to     guide or monitor treatment.     Labs: Basic Metabolic Panel:  Recent Labs Lab 05/16/12 2315 05/17/12 0945 05/18/12 0415 05/19/12 0409 05/19/12 1845 05/21/12 0435  NA 140 142 139 139 137 137  K 4.1 4.8 3.8 3.9 4.1 4.0  CL 102 107 104 105 101 103  CO2 27 26 25 24 26 27   GLUCOSE 109* 105* 96 88 82 91  BUN 14 10 10 10 10 9   CREATININE 0.95 0.93 1.00 1.01 0.98 0.98  CALCIUM 9.6 8.9 8.8 8.6 9.2 8.7  MG  --  2.1  --   --   --   --   PHOS  --  2.4  --   --   --   --    Liver Function Tests:  Recent Labs Lab 05/17/12 0945 05/18/12 0415 05/19/12 0409 05/19/12 1845 05/21/12 0435  AST 237* 128* 58* 47* 57*  ALT 608* 455* 305* 277* 195*  ALKPHOS 165* 172* 166* 181* 193*  BILITOT 3.2* 4.0* 1.4* 1.2 0.9  PROT 7.4 7.0 6.8 7.9 7.2  ALBUMIN 3.3* 3.2* 3.0* 3.4* 3.1*    Recent Labs Lab 05/16/12 2315  LIPASE 34   No results found for this basename: AMMONIA,  in the last 168  hours CBC:  Recent Labs Lab 05/16/12 2315 05/17/12 0945 05/18/12 0415 05/21/12 0435  WBC 5.5 4.5 5.0 7.0  NEUTROABS 3.6 2.0  --   --   HGB 15.1 13.5 13.3 13.4  HCT 44.9 41.0 39.8 40.7  MCV 81.6 82.3 80.2 81.2  PLT 276 251 238 268   Cardiac Enzymes: No results found for this basename: CKTOTAL, CKMB, CKMBINDEX, TROPONINI,  in the last 168 hours BNP: BNP (last 3 results) No results found for this basename: PROBNP,  in the last 8760 hours CBG:  Recent Labs Lab 05/19/12 0747 05/20/12 0728  GLUCAP 93 75       Signed:  DHUNGEL, NISHANT  [facility] 05/21/2012, 12:39 PM

## 2012-05-21 NOTE — Progress Notes (Signed)
Subjective: Doing well, but he is sore from the surgery.  Objective: Vital signs in last 24 hours: Temp:  [97.5 F (36.4 C)-98.2 F (36.8 C)] 98.2 F (36.8 C) (03/14 0555) Pulse Rate:  [56-75] 58 (03/14 0555) Resp:  [9-20] 18 (03/14 0555) BP: (120-154)/(63-86) 120/76 mmHg (03/14 0555) SpO2:  [98 %-100 %] 99 % (03/14 0555) Last BM Date: 05/16/12  Intake/Output from previous day: 03/13 0701 - 03/14 0700 In: 2300 [I.V.:2300] Out: 2650 [Urine:2650] Intake/Output this shift:    General appearance: alert and no distress GI: tender at the incision sites  Lab Results:  Recent Labs  05/21/12 0435  WBC 7.0  HGB 13.4  HCT 40.7  PLT 268   BMET  Recent Labs  05/19/12 0409 05/19/12 1845 05/21/12 0435  NA 139 137 137  K 3.9 4.1 4.0  CL 105 101 103  CO2 24 26 27   GLUCOSE 88 82 91  BUN 10 10 9   CREATININE 1.01 0.98 0.98  CALCIUM 8.6 9.2 8.7   LFT  Recent Labs  05/21/12 0435  PROT 7.2  ALBUMIN 3.1*  AST 57*  ALT 195*  ALKPHOS 193*  BILITOT 0.9   PT/INR No results found for this basename: LABPROT, INR,  in the last 72 hours Hepatitis Panel No results found for this basename: HEPBSAG, HCVAB, HEPAIGM, HEPBIGM,  in the last 72 hours C-Diff No results found for this basename: CDIFFTOX,  in the last 72 hours Fecal Lactopherrin No results found for this basename: FECLLACTOFRN,  in the last 72 hours  Studies/Results: Dg Cholangiogram Operative  05/20/2012  *RADIOLOGY REPORT*  Clinical Data:   Abdominal pain, laparoscopic cholecystectomy  INTRAOPERATIVE CHOLANGIOGRAM  Technique:  Cholangiographic images from the C-arm fluoroscopic device were submitted for interpretation post-operatively.  Please see the procedural report for the amount of contrast and the fluoroscopy time utilized.  Comparison:  None  Findings:  No persistent filling defects in the common duct. Intrahepatic ducts are incompletely visualized, appearing decompressed centrally. Contrast passes into the  duodenum.  IMPRESSION  Negative for retained common duct stone.   Original Report Authenticated By: D. Andria Rhein, MD    Mr 3d Recon At Scanner  05/19/2012  *RADIOLOGY REPORT*  Clinical Data:  Cholelithiasis with dilated common bile duct.  Lack of excretion of contrast into the common bile duct.  MRI ABDOMEN WITHOUT AND WITH CONTRAST (MRCP)  Technique:  Multiplanar multisequence MR imaging of the abdomen was performed without and with contrast, including heavily T2-weighted images of the biliary and pancreatic ducts.  Three-dimensional MR images were rendered by post processing of the original MR data.  Contrast: 20mL MULTIHANCE GADOBENATE DIMEGLUMINE 529 MG/ML IV SOLN  Comparison:  Multiple exams, including 05/17/2012  Findings:  Two gallstones each measuring up to 2 cm are identified with gallbladder wall thickening and a small amount of pericholecystic fluid.  Mild intrahepatic bile duct dilatation is present along with mild common hepatic duct dilatation at 1 cm, and mild common bile duct dilatation and 0.6 cm.  The cystic duct attachment is somewhat low in position.  Dorsal pancreatic duct unremarkable.  MRCP does not reveal compelling findings of stricture or filling defect in the common bile duct on today's exam; there is smooth conical tapering of the duct shown on image 27 of series 13.  No obvious enhancing ampullary lesion.  The pancreas enhances normally.  A peripancreatic lymph node is mildly enlarged at 1.1 cm in short axis on image 39 of series 1501. No definite irregularity of  the common bile duct is observed.  Two small lesions in segment 2 of the liver demonstrate initial peripheral marginal nodular enhancement with subsequent fill-in and delayed enhancement, characteristic of a small hemangioma.  No significant renal, splenic, or adrenal abnormality.  There is a suggestion of trace free pelvic fluid on the coronal images.  IMPRESSION:  1.  Mild intra- and extrahepatic biliary dilatation, with  smooth conical tapering of the distal common bile duct in the vicinity of the ampulla, but without a visible filling defect or irregularity. The possibility of a recently passed stone causing stenosis at the ampulla is raised as a possibility given the lack of other explaining factors for the lack of bowel excretion and biliary dilatation.  Occult stone or occult stricture could likewise be considered. 2.  Gallbladder wall thickening with pericholecystic fluid and gallstones - correlate clinically in assessing for cholecystitis. 3.  There are too small hemangioma is in segment two of the liver.  4.  Mildly enlarged peripancreatic lymph node, likely reactive. 5.  Incidentally, the attachment of the cystic duct to the common hepatic duct/common bile duct is somewhat low in position, along the superior margin of the pancreatic head.   Original Report Authenticated By: Gaylyn Rong, M.D.    Mr Abd W/wo Cm/mrcp  05/19/2012  *RADIOLOGY REPORT*  Clinical Data:  Cholelithiasis with dilated common bile duct.  Lack of excretion of contrast into the common bile duct.  MRI ABDOMEN WITHOUT AND WITH CONTRAST (MRCP)  Technique:  Multiplanar multisequence MR imaging of the abdomen was performed without and with contrast, including heavily T2-weighted images of the biliary and pancreatic ducts.  Three-dimensional MR images were rendered by post processing of the original MR data.  Contrast: 20mL MULTIHANCE GADOBENATE DIMEGLUMINE 529 MG/ML IV SOLN  Comparison:  Multiple exams, including 05/17/2012  Findings:  Two gallstones each measuring up to 2 cm are identified with gallbladder wall thickening and a small amount of pericholecystic fluid.  Mild intrahepatic bile duct dilatation is present along with mild common hepatic duct dilatation at 1 cm, and mild common bile duct dilatation and 0.6 cm.  The cystic duct attachment is somewhat low in position.  Dorsal pancreatic duct unremarkable.  MRCP does not reveal compelling  findings of stricture or filling defect in the common bile duct on today's exam; there is smooth conical tapering of the duct shown on image 27 of series 13.  No obvious enhancing ampullary lesion.  The pancreas enhances normally.  A peripancreatic lymph node is mildly enlarged at 1.1 cm in short axis on image 39 of series 1501. No definite irregularity of the common bile duct is observed.  Two small lesions in segment 2 of the liver demonstrate initial peripheral marginal nodular enhancement with subsequent fill-in and delayed enhancement, characteristic of a small hemangioma.  No significant renal, splenic, or adrenal abnormality.  There is a suggestion of trace free pelvic fluid on the coronal images.  IMPRESSION:  1.  Mild intra- and extrahepatic biliary dilatation, with smooth conical tapering of the distal common bile duct in the vicinity of the ampulla, but without a visible filling defect or irregularity. The possibility of a recently passed stone causing stenosis at the ampulla is raised as a possibility given the lack of other explaining factors for the lack of bowel excretion and biliary dilatation.  Occult stone or occult stricture could likewise be considered. 2.  Gallbladder wall thickening with pericholecystic fluid and gallstones - correlate clinically in assessing for cholecystitis. 3.  There are too small hemangioma is in segment two of the liver.  4.  Mildly enlarged peripancreatic lymph node, likely reactive. 5.  Incidentally, the attachment of the cystic duct to the common hepatic duct/common bile duct is somewhat low in position, along the superior margin of the pancreatic head.   Original Report Authenticated By: Gaylyn Rong, M.D.     Medications:  Scheduled: . budesonide-formoterol  2 puff Inhalation BID  . pantoprazole  40 mg Oral Daily  . sertraline  100 mg Oral Daily   Continuous: . sodium chloride 75 mL/hr at 05/21/12 0148    Assessment/Plan: 1)  Cholelithiasis.   Clinically he is progressing.  No further admission abdominal pain since the cholecystectomy.  IOC was negative for any abnormalities.  Plan: 1) Routine post-operative care per surgery.   LOS: 5 days   HUNG,PATRICK D 05/21/2012, 8:42 AM

## 2012-05-21 NOTE — Discharge Summary (Deleted)
error 

## 2012-05-22 ENCOUNTER — Telehealth (INDEPENDENT_AMBULATORY_CARE_PROVIDER_SITE_OTHER): Payer: Self-pay | Admitting: General Surgery

## 2012-05-22 NOTE — Telephone Encounter (Signed)
Patient called in complaining of itching and rash on his back, chest as well as some facial swelling after cholecystectomy yesterday. Patient is not taking any medication since discharge. Denies difficulty breathing or swallowing. Denies fever or chills. Advised patient he probably had a reaction to the preoperative antibiotic. Advised him to take Benadryl as needed and to call back if symptoms worsen

## 2012-05-25 ENCOUNTER — Telehealth (INDEPENDENT_AMBULATORY_CARE_PROVIDER_SITE_OTHER): Payer: Self-pay | Admitting: *Deleted

## 2012-05-25 ENCOUNTER — Encounter (INDEPENDENT_AMBULATORY_CARE_PROVIDER_SITE_OTHER): Payer: Self-pay | Admitting: *Deleted

## 2012-05-25 NOTE — Telephone Encounter (Signed)
Patient called to ask for a note to [facility] on 05/31/12.  Letter placed at the front desk for patient to pick up.

## 2012-05-27 ENCOUNTER — Encounter (INDEPENDENT_AMBULATORY_CARE_PROVIDER_SITE_OTHER): Payer: Self-pay

## 2012-05-27 ENCOUNTER — Encounter (INDEPENDENT_AMBULATORY_CARE_PROVIDER_SITE_OTHER): Payer: Self-pay | Admitting: *Deleted

## 2012-06-08 ENCOUNTER — Encounter (INDEPENDENT_AMBULATORY_CARE_PROVIDER_SITE_OTHER): Payer: Self-pay | Admitting: Internal Medicine

## 2012-06-08 ENCOUNTER — Ambulatory Visit (INDEPENDENT_AMBULATORY_CARE_PROVIDER_SITE_OTHER): Payer: Federal, State, Local not specified - PPO | Admitting: Internal Medicine

## 2012-06-08 VITALS — BP 110/74 | HR 59 | Temp 98.6°F | Resp 18 | Ht 67.5 in | Wt 220.0 lb

## 2012-06-08 DIAGNOSIS — K8050 Calculus of bile duct without cholangitis or cholecystitis without obstruction: Secondary | ICD-10-CM

## 2012-06-08 NOTE — Progress Notes (Signed)
  Subjective: Pt returns to the clinic today after undergoing laparoscopic cholecystectomy on 05/20/12 by Dr. Carolynne Edouard.  The patient is tolerating their diet well and is having no severe pain.  Bowel function is good.  No problems with the wounds.  Objective: Vital signs in last 24 hours: Reviewed  PE: Abd: soft, non-tender, +bs, incisions well healed  Lab Results:  No results found for this basename: WBC, HGB, HCT, PLT,  in the last 72 hours BMET No results found for this basename: NA, K, CL, CO2, GLUCOSE, BUN, CREATININE, CALCIUM,  in the last 72 hours PT/INR No results found for this basename: LABPROT, INR,  in the last 72 hours CMP     Component Value Date/Time   NA 137 05/21/2012 0435   K 4.0 05/21/2012 0435   CL 103 05/21/2012 0435   CO2 27 05/21/2012 0435   GLUCOSE 91 05/21/2012 0435   BUN 9 05/21/2012 0435   CREATININE 0.98 05/21/2012 0435   CALCIUM 8.7 05/21/2012 0435   PROT 7.2 05/21/2012 0435   ALBUMIN 3.1* 05/21/2012 0435   AST 57* 05/21/2012 0435   ALT 195* 05/21/2012 0435   ALKPHOS 193* 05/21/2012 0435   BILITOT 0.9 05/21/2012 0435   GFRNONAA >90 05/21/2012 0435   GFRAA >90 05/21/2012 0435   Lipase     Component Value Date/Time   LIPASE 34 05/16/2012 2315       Studies/Results: No results found.  Anti-infectives: Anti-infectives   None       Assessment/Plan  1.  S/P Laparoscopic Cholecystectomy: doing well, may resume regular activity without restrictions, Pt will follow up with [place] [employer] and knows to call with questions or concerns.     WHITE, ELIZABETH 06/08/2012

## 2012-06-08 NOTE — Patient Instructions (Signed)
May resume regular activity without restrictions. Follow up as needed. Call with questions or concerns.  

## 2013-08-26 IMAGING — CT CT ABD-PELV W/ CM
1 of 3 series · 14 of 32 positions shown, 19 images · IV contrast (100 ML OMNI 300)
Comparison: 05/17/2012 ultrasound

CLINICAL DATA: Lower abdominal pain

CT ABDOMEN AND PELVIS WITH CONTRAST
TECHNIQUE: Multidetector CT imaging of the abdomen and pelvis was
performed using the standard protocol following bolus
administration of intravenous contrast.
Contrast: 100mL OMNIPAQUE IOHEXOL 300 MG/ML  SOLN 100 ml Omnipaque-
300

[Series 2: abd/pel with · axial · 0.74mm/px · z∈[+1232,+1612]mm · 14 of 86 slices shown, 19 images]
[im 5/86  soft-tissue]
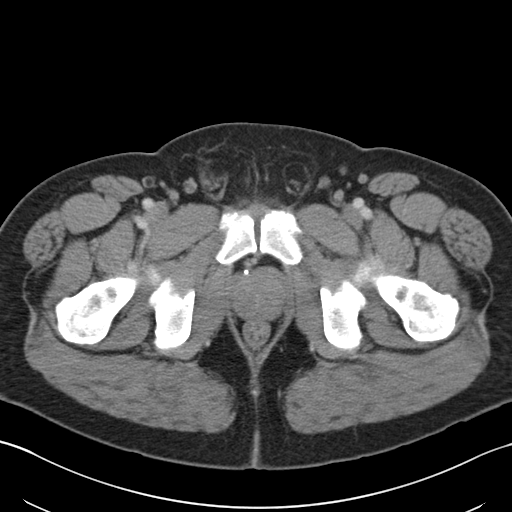
[im 5/86  bone]
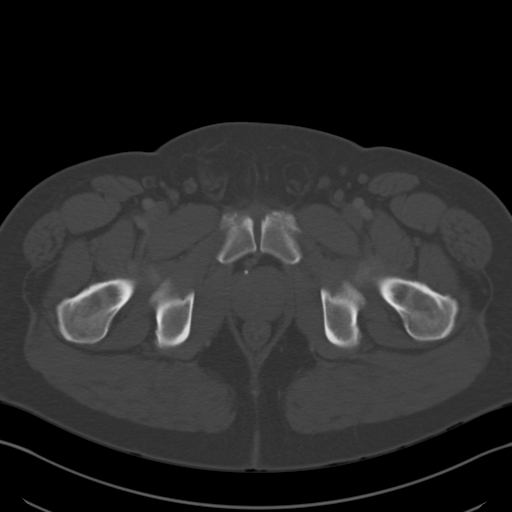
[im 14/86  soft-tissue]
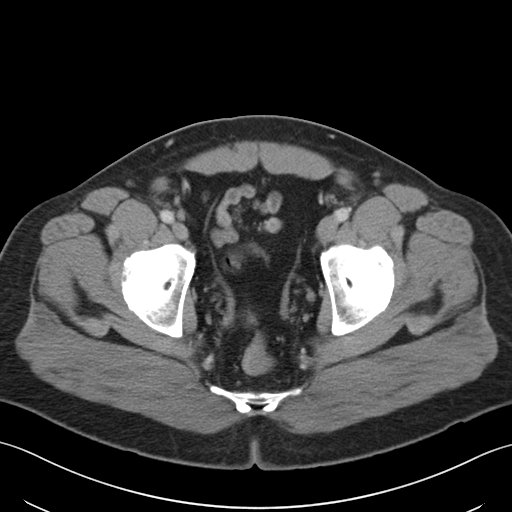
[im 18/86  soft-tissue]
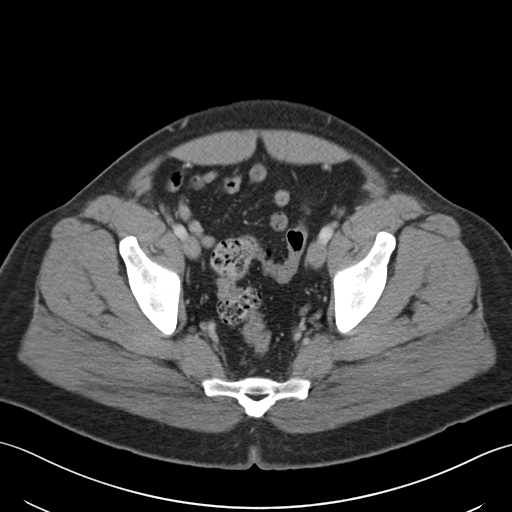
[im 23/86  soft-tissue]
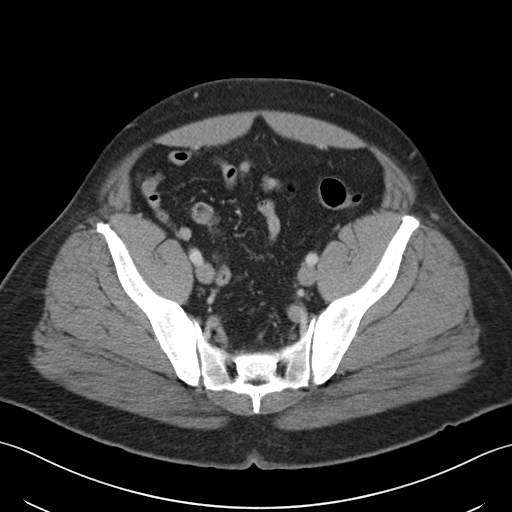
[im 32/86  soft-tissue]
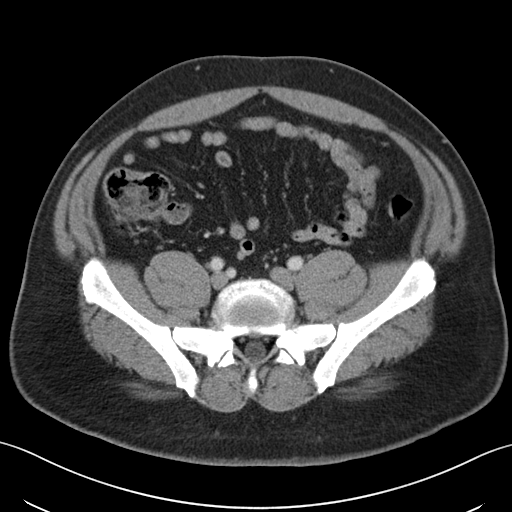
[im 36/86  soft-tissue]
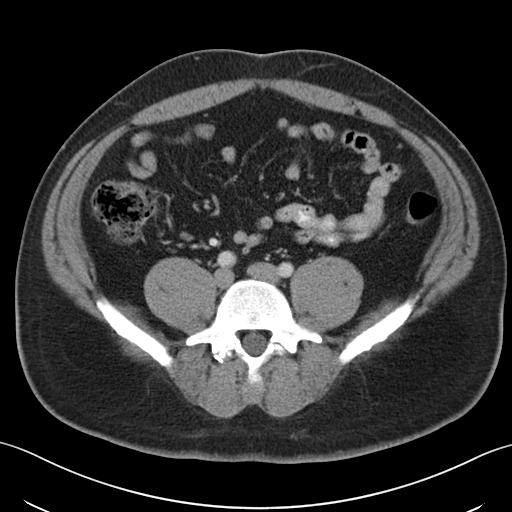
[im 45/86  soft-tissue]
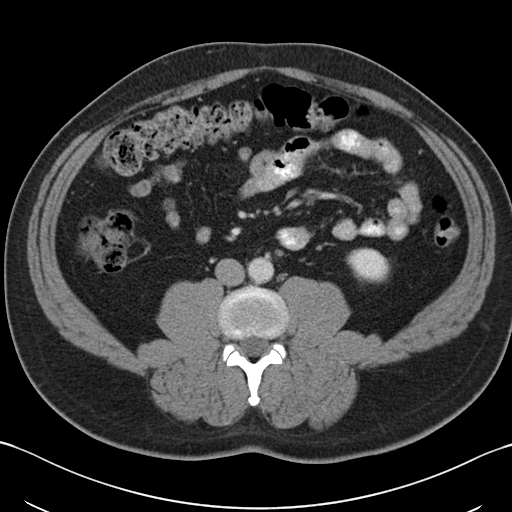
[im 50/86  soft-tissue]
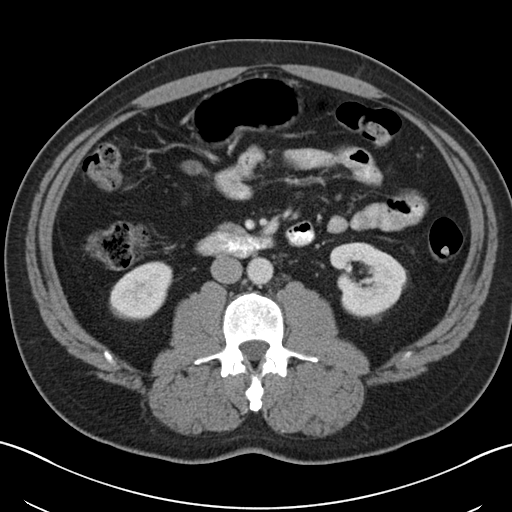
[im 54/86  soft-tissue]
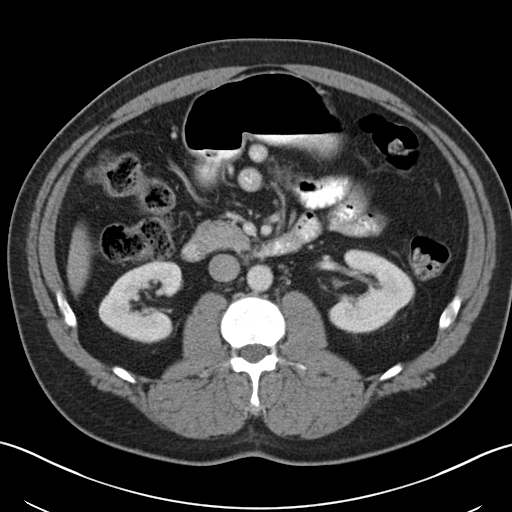
[im 54/86  bone]
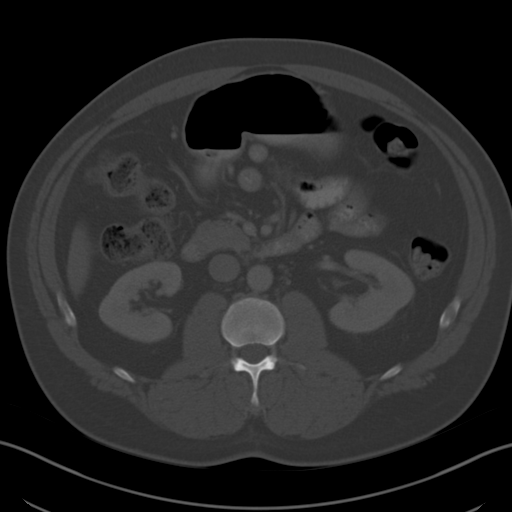
[im 63/86  soft-tissue]
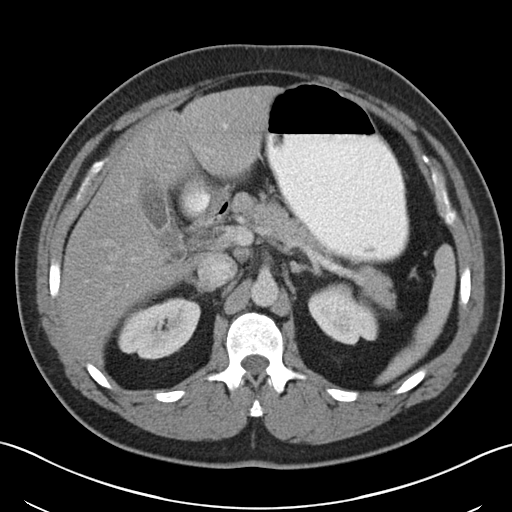
[im 68/86  soft-tissue]
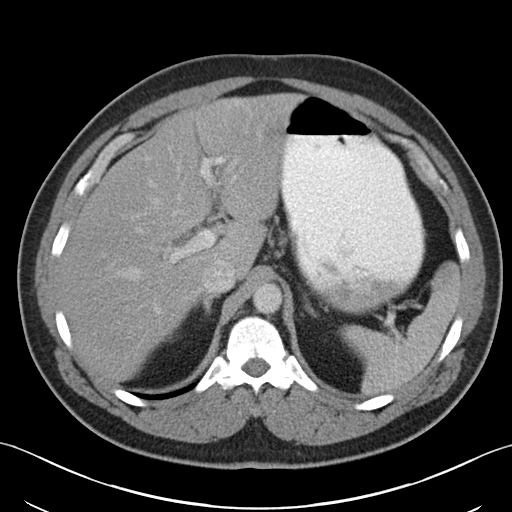
[im 68/86  lung]
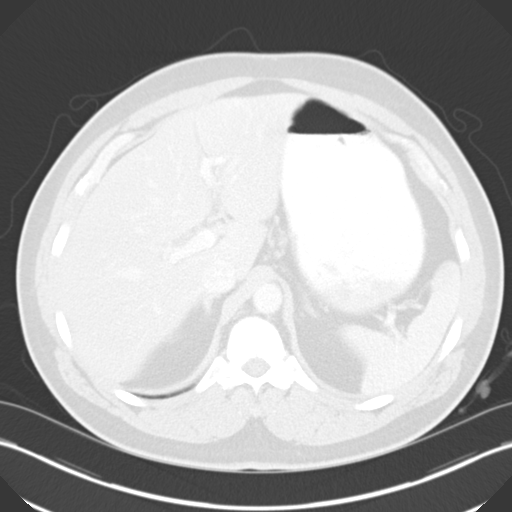
[im 72/86  soft-tissue]
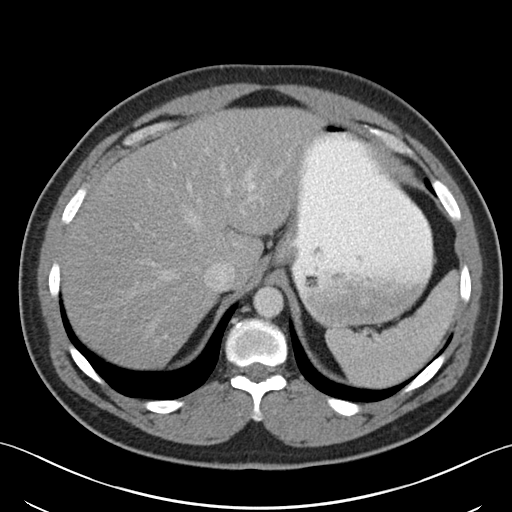
[im 72/86  lung]
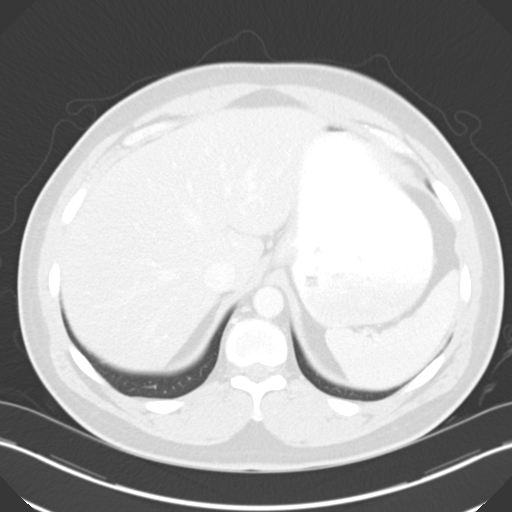
[im 77/86  lung]
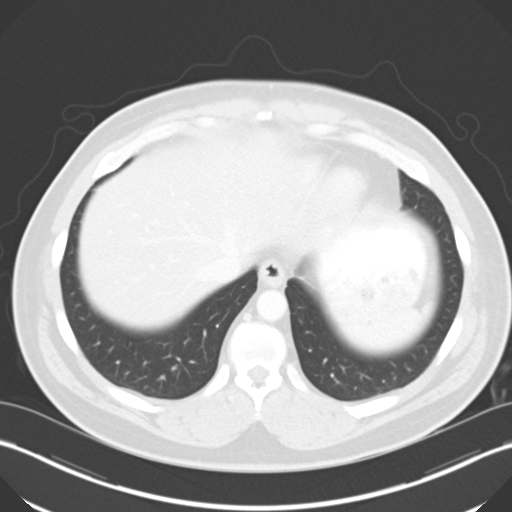
[im 81/86  soft-tissue]
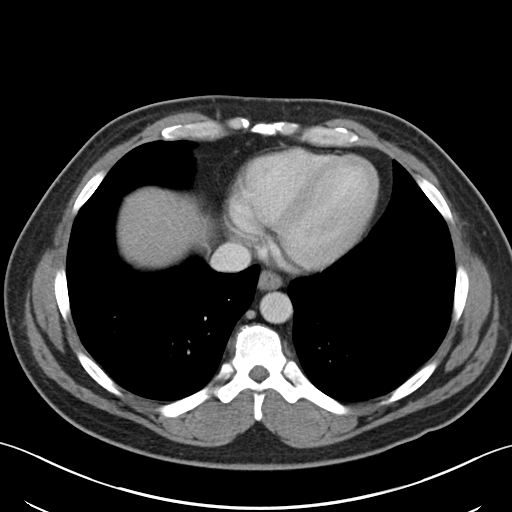
[im 81/86  lung]
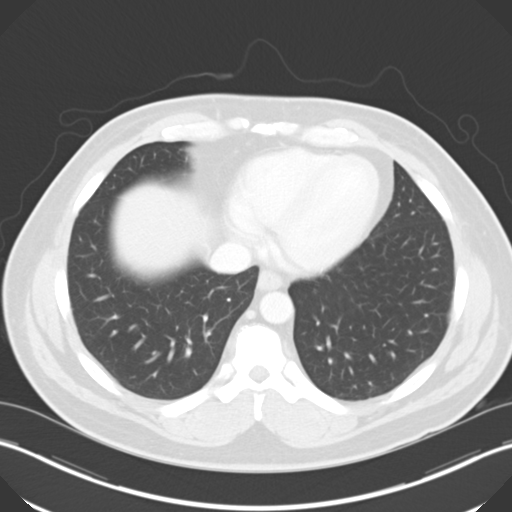

[14 of 32 positions shown; findings below may reference images not displayed]

FINDINGS: Limited images through the lung bases demonstrate no
significant appreciable abnormality. The heart size is within
normal limits. No pleural or pericardial effusion.

Hepatic steatosis.  Unremarkable spleen, pancreas, adrenal glands.

Gallstones.  No gallbladder wall thickening or pericholecystic
fluid. Minimal prominence of the CBD at 7 mm, tapering along the
course smoothly to the level of the ampulla.

Symmetric renal enhancement.  No hydronephrosis or hydroureter

No CT evidence for colitis.  Normal appendix.  Small bowel loops
are normal course and caliber.  Nonspecific gastric distension.

No free intraperitoneal air or fluid.  Tiny umbilical hernia
contains fat.  No lymphadenopathy.  Normal caliber aorta and branch
vessels.

Decompressed bladder.

No acute osseous finding. L5 pars defects.
IMPRESSION: Hepatic steatosis.

Gallstones] and nonspecific mild wall thickening.  Correlate with
HIDA scan if clinical concern for acute cholecystitis persists.

## 2014-08-28 ENCOUNTER — Ambulatory Visit (INDEPENDENT_AMBULATORY_CARE_PROVIDER_SITE_OTHER): Payer: Federal, State, Local not specified - PPO | Admitting: Family Medicine

## 2014-08-28 VITALS — BP 120/84 | HR 99 | Temp 98.1°F | Resp 17 | Ht 68.0 in | Wt 232.0 lb

## 2014-08-28 DIAGNOSIS — J4531 Mild persistent asthma with (acute) exacerbation: Secondary | ICD-10-CM

## 2014-08-28 MED ORDER — ALBUTEROL SULFATE (2.5 MG/3ML) 0.083% IN NEBU: 2.5000 mg | INHALATION_SOLUTION | Freq: Four times a day (QID) | RESPIRATORY_TRACT | Status: DC | PRN

## 2014-08-28 MED ORDER — ALBUTEROL SULFATE (2.5 MG/3ML) 0.083% IN NEBU
2.5000 mg | INHALATION_SOLUTION | Freq: Once | RESPIRATORY_TRACT | Status: AC
  Administered 2014-08-28: 2.5 mg via RESPIRATORY_TRACT

## 2014-08-28 MED ORDER — PREDNISONE 20 MG PO TABS: ORAL_TABLET | ORAL | Status: DC

## 2014-08-28 NOTE — Patient Instructions (Signed)

## 2014-08-28 NOTE — Progress Notes (Signed)
This is a 54 year old gentleman with known asthma who is been well controlled until 2 days ago. At that time, he mowed his father's lawn which was quite dusty at the time. He developed a bad cough which is persisted to this point as well as associated shortness of breath.  Patient has enough in the way of inhalers but they have not been enough to satisfy his breathing problem.  This morning he went to the [facility] hospital in [place] and waited 4 hours for evaluation. They did do a chest x-ray which they said was normal, but they did not do any physical exam nor treatment.  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 He's currently taking Symbicort and albuterol.  Objective: Middle-aged adult who appears to be somewhat short of breath although there are no audible wheezes.  BP 120/84 mmHg  Pulse 99  Temp(Src) 98.1 F (36.7 C) (Oral)  Resp 17  Ht 5\' 8"  (1.727 m)  Wt 232 lb (105.235 kg)  BMI 35.28 kg/m2  SpO2 97% HEENT: Unremarkable Neck: Supple no adenopathy Chest: Bilateral expiratory wheezes, no adventitial respiratory muscle retractions Heart: Regular, no murmur, borderline tachycardia Skin: No cyanosis  Patient given breathing treatment on admission to the clinic  Exam after breathing treatment:  Markedly improved   This chart was scribed in my presence and reviewed by me personally.    ICD-9-CM ICD-10-CM   1. Asthma with acute exacerbation, mild persistent 493.92 J45.31 predniSONE (DELTASONE) 20 MG tablet     PR INHAL RX, AIRWAY OBST/DX SPUTUM INDUCT     albuterol (PROVENTIL) (2.5 MG/3ML) 0.083% nebulizer solution 2.5 mg     DME Nebulizer machine     albuterol (PROVENTIL) (2.5 MG/3ML) 0.083% nebulizer solution     Signed, Elvina Sidle, MD

## 2015-10-24 ENCOUNTER — Ambulatory Visit (INDEPENDENT_AMBULATORY_CARE_PROVIDER_SITE_OTHER): Payer: Non-veteran care | Admitting: Allergy and Immunology

## 2015-10-24 ENCOUNTER — Encounter: Payer: Self-pay | Admitting: Allergy and Immunology

## 2015-10-24 VITALS — BP 120/82 | HR 60 | Temp 98.2°F | Resp 14 | Ht 67.0 in | Wt 240.0 lb

## 2015-10-24 DIAGNOSIS — H1010 Acute atopic conjunctivitis, unspecified eye: Secondary | ICD-10-CM

## 2015-10-24 DIAGNOSIS — J309 Allergic rhinitis, unspecified: Secondary | ICD-10-CM

## 2015-10-24 DIAGNOSIS — J4540 Moderate persistent asthma, uncomplicated: Secondary | ICD-10-CM

## 2015-10-24 DIAGNOSIS — Z91018 Allergy to other foods: Secondary | ICD-10-CM

## 2015-10-24 DIAGNOSIS — K219 Gastro-esophageal reflux disease without esophagitis: Secondary | ICD-10-CM | POA: Diagnosis not present

## 2015-10-24 DIAGNOSIS — J4531 Mild persistent asthma with (acute) exacerbation: Secondary | ICD-10-CM

## 2015-10-24 MED ORDER — MONTELUKAST SODIUM 10 MG PO TABS
10.0000 mg | ORAL_TABLET | Freq: Every day | ORAL | 3 refills | Status: DC
Start: 2015-10-24 — End: 2019-05-31

## 2015-10-24 MED ORDER — BUDESONIDE-FORMOTEROL FUMARATE 160-4.5 MCG/ACT IN AERO: 2.0000 | INHALATION_SPRAY | Freq: Two times a day (BID) | RESPIRATORY_TRACT | 3 refills | Status: DC

## 2015-10-24 MED ORDER — FLUTICASONE PROPIONATE 50 MCG/ACT NA SUSP: 2.0000 | Freq: Every day | NASAL | 3 refills | Status: DC

## 2015-10-24 MED ORDER — TIOTROPIUM BROMIDE MONOHYDRATE 1.25 MCG/ACT IN AERS: 2.0000 | INHALATION_SPRAY | Freq: Every day | RESPIRATORY_TRACT | 1 refills | Status: DC

## 2015-10-24 MED ORDER — EPINEPHRINE 0.3 MG/0.3ML IJ SOAJ: 0.3000 mg | Freq: Once | INTRAMUSCULAR | 1 refills | Status: AC

## 2015-10-24 MED ORDER — OMEPRAZOLE 20 MG PO CPDR: DELAYED_RELEASE_CAPSULE | ORAL | 3 refills | Status: DC

## 2015-10-24 MED ORDER — ALBUTEROL SULFATE HFA 108 (90 BASE) MCG/ACT IN AERS: 2.0000 | INHALATION_SPRAY | Freq: Four times a day (QID) | RESPIRATORY_TRACT | 1 refills | Status: DC | PRN

## 2015-10-24 NOTE — Progress Notes (Signed)
Dear Dr. Blanchard Simmons,  Thank you for referring Harry Simmons to the [facility] [place] on 10/24/2015.   Below is a summation of this patient's evaluation and recommendations.  Thank you for your referral. I will keep you informed about this patient's response to treatment.   If you have any questions please to do hesitate to contact me.   Sincerely,  Harry Priest, MD [facility]   ______________________________________________________________________    NEW PATIENT NOTE  Referring Provider: Pecolia Ades, MD Primary Provider: Default, Provider, MD Date of office visit: 10/24/2015    Subjective:   Chief Complaint:  BRETTON Simmons (DOB: Sep 24, 1960) is a 55 y.o. male with a chief complaint of Asthma; Allergic Rhinitis ; and Nasal Congestion  who presents to the clinic on 10/24/2015 with the following problems:  HPI: [name] presents to this clinic in evaluation of allergies and asthma and food problems that have been a long-standing issue of many years duration.  He has developed problems with sneezing and nasal congestion and itchy watery eyes especially following exposure to the outdoors especially when mowing the grass. This is been present since childhood. It has been somewhat progressive over the course of the past several years and is much worse during the fall time of the year. He uses Flonase which does help yet he still continues to have some significant problems even in the face of utilizing this therapy. It does not sound as though he has required antibiotics for episodes of sinusitis. He can smell without any problem. He does not have a history of recurrent headaches.  [name] also has an issue with asthma. He developed this in 1991 after participating in [facility]. He has triggers of exercise and heat and grass exposure. His symptoms do respond to a short acting bronchodilator and  are manifested as wheezing and coughing and shortness of breath. His requirement for short acting bronchodilator is approximately 3 times per week. This occurs even though he continues to use his controller agent which appears to be Symbicort and montelukast at this point in time.  He also has a history with food allergy. Around the age of 42 or so he developed a problem with facial swelling and periorbital swelling and throat swelling and inability to breathe after eating lobster. He can apparently eat oysters and crab without any problem but does not eat any other shellfish. He does not have an EpiPen.  [name] also has a issue with reflux. He is using a proton pump inhibitor which he thinks is working relatively well. However, he does have a raspy voice.  Past Medical History:  Diagnosis Date  . Allergic rhinitis   . Asthma   . GERD (gastroesophageal reflux disease)   . PTSD (post-traumatic stress disorder)     Past Surgical History:  Procedure Laterality Date  . CHOLECYSTECTOMY N/A 05/20/2012   Procedure: LAPAROSCOPIC CHOLECYSTECTOMY WITH INTRAOPERATIVE CHOLANGIOGRAM;  Surgeon: Robyne Askew, MD;  Location: [facility];  Service: General;  Laterality: N/A;  . COLONOSCOPY    . ERCP N/A 05/18/2012   Procedure: ENDOSCOPIC RETROGRADE CHOLANGIOPANCREATOGRAPHY (ERCP);  Surgeon: Theda Belfast, MD;  Location: [facility];  Service: Endoscopy;  Laterality: N/A;      Medication List      acetaminophen 500 MG tablet Commonly known as:  TYLENOL Take 500 mg by mouth 3 (three) times daily as needed.   albuterol 108 (90 Base)  MCG/ACT inhaler Commonly known as:  PROVENTIL HFA;VENTOLIN HFA Inhale 2 puffs into the lungs every 6 (six) hours as needed.   albuterol (2.5 MG/3ML) 0.083% nebulizer solution Commonly known as:  PROVENTIL Take 3 mLs (2.5 mg total) by nebulization every 6 (six) hours as needed for wheezing or shortness of breath.   budesonide-formoterol 160-4.5 MCG/ACT inhaler Commonly  known as:  SYMBICORT Inhale 2 puffs into the lungs 2 (two) times daily.   buPROPion 150 MG 12 hr tablet Commonly known as:  WELLBUTRIN SR Take 150 mg by mouth daily.   clobetasol ointment 0.05 % Commonly known as:  TEMOVATE Apply 1 application topically 2 (two) times daily.   dibucaine 1 % ointment Commonly known as:  NUPERCAINAL Apply 1 application topically 3 (three) times daily as needed for pain.   docusate sodium 100 MG capsule Commonly known as:  COLACE Take 200 mg by mouth at bedtime as needed for mild constipation.   fluticasone 50 MCG/ACT nasal spray Commonly known as:  FLONASE Place 2 sprays into both nostrils daily.   hydrocortisone 2.5 % cream Apply 1 application topically 2 (two) times daily.   lidocaine 4 % cream Commonly known as:  LMX Apply 1 application topically 3 (three) times daily as needed.   loratadine 10 MG tablet Commonly known as:  CLARITIN Take 10 mg by mouth daily as needed. For allergies   methocarbamol 750 MG tablet Commonly known as:  ROBAXIN Take 750 mg by mouth at bedtime as needed for muscle spasms.   montelukast 10 MG tablet Commonly known as:  SINGULAIR Take 10 mg by mouth at bedtime.   omeprazole 20 MG capsule Commonly known as:  PRILOSEC Take 20 mg by mouth daily.   PRE-MOISTENED WITCH HAZEL 50 % Pads Apply topically.   PSYLLIUM PO Take by mouth.   sertraline 100 MG tablet Commonly known as:  ZOLOFT Take 100 mg by mouth daily.   sildenafil 100 MG tablet Commonly known as:  VIAGRA Take 100 mg by mouth daily as needed for erectile dysfunction.   traMADol 50 MG tablet Commonly known as:  ULTRAM Take 50 mg by mouth 2 (two) times daily.   traZODone 50 MG tablet Commonly known as:  DESYREL Take 50 mg by mouth at bedtime.   zolpidem 10 MG tablet Commonly known as:  AMBIEN Take 10 mg by mouth at bedtime as needed for sleep.        Allergies  Allergen Reactions  . Codeine Hives and Rash    Tolerates Dilaudid.    Josefina Do Allergy] Shortness Of Breath    Patient stated not all shellfish just lobster    Review of systems negative except as noted in HPI / PMHx or noted below:  Review of Systems  Constitutional: Negative.   HENT: Negative.   Eyes: Negative.   Respiratory: Negative.   Cardiovascular: Negative.   Gastrointestinal: Negative.   Genitourinary: Negative.   Musculoskeletal: Negative.   Skin: Negative.   Neurological: Negative.   Endo/Heme/Allergies: Negative.   Psychiatric/Behavioral: Negative.     Family History  Problem Relation Age of Onset  . Diabetes Mother   . Hypotension Mother   . Hypertension Father   . CAD Father   . Diabetes Sister   . CAD Sister   . Hypertension Sister   . Allergic rhinitis Sister   . Asthma Sister   . Diabetes Brother     Social History   Social History  . Marital status: Married  Spouse name: N/A  . Number of children: N/A  . Years of education: N/A   Occupational History  . Not on file.   Social History Main Topics  . Smoking status: Never Smoker  . Smokeless tobacco: Never Used  . Alcohol use No  . Drug use: No  . Sexual activity: Yes   Other Topics Concern  . Not on file   Social History Narrative  . No narrative on file    Environmental and Social history  Lives in a house with a dry environment, no animals located inside the household, no carpeting in the bedroom, plastic on the pillow but not the bed, and no smokers located inside the household   Objective:   Vitals:   10/24/15 0855  BP: 120/82  Pulse: 60  Resp: 14  Temp: 98.2 F (36.8 C)   Height: 5\' 7"  (170.2 cm) Weight: 240 lb (108.9 kg)  Physical Exam  Constitutional: He is well-developed, well-nourished, and in no distress.  Raspy voice  HENT:  Head: Normocephalic. Head is without right periorbital erythema and without left periorbital erythema.  Right Ear: Tympanic membrane, external ear and ear canal normal.  Left Ear: Tympanic  membrane, external ear and ear canal normal.  Nose: Nose normal. No mucosal edema or rhinorrhea.  Mouth/Throat: Oropharynx is clear and moist and mucous membranes are normal. No oropharyngeal exudate.  Eyes: Conjunctivae and lids are normal. Pupils are equal, round, and reactive to light.  Neck: Trachea normal. No tracheal deviation present. No thyromegaly present.  Cardiovascular: Normal rate, regular rhythm, S1 normal, S2 normal and normal heart sounds.   No murmur heard. Pulmonary/Chest: Effort normal. No stridor. No tachypnea. No respiratory distress. He has no wheezes. He has no rales. He exhibits no tenderness.  Abdominal: Soft. He exhibits no distension and no mass. There is no hepatosplenomegaly. There is no tenderness. There is no rebound and no guarding.  Musculoskeletal: He exhibits no edema or tenderness.  Lymphadenopathy:       Head (right side): No tonsillar adenopathy present.       Head (left side): No tonsillar adenopathy present.    He has no cervical adenopathy.    He has no axillary adenopathy.  Neurological: He is alert. Gait normal.  Skin: No rash noted. He is not diaphoretic. No erythema. No pallor. Nails show no clubbing.  Psychiatric: Mood and affect normal.     Diagnostics: Allergy skin tests were performed. He demonstrated hypersensitivity against grasses, weeds, trees, and house dust mite, cat, dog, cockroach, and shellfish mix  Spirometry was performed and demonstrated an FEV1 of 2.16 @ 79 % of predicted. Following the administration of nebulized albuterol his FEV1 rose to 2.57 which was an increase in the FEV1 of 19%  The patient had an Asthma Control Test with the following results: ACT Total Score: 13.     Assessment and Plan:    1. Asthma, moderate persistent, well-controlled   2. Allergic rhinoconjunctivitis   3. Food allergy   4. Gastroesophageal reflux disease, esophagitis presence not specified   5. Asthma with acute exacerbation, mild  persistent     1. Allergen avoidance measures  2. Treat and prevent inflammation:   A. continue Flonase one-2 sprays each nostril one time per day  B. continue montelukast 10 mg one tablet one time per day  C. continue Symbicort 160 2 inhalations twice a day  D. start Spiriva 1.25 respimat 2 inhalations one time per day  3. Continue therapy for reflux  with omeprazole 20 mg twice a day  4. If needed:   A. Proventil/Pro-air/Ventolin 2 puffs every 4-6 hours if needed  B. Antihistamine - Claritin/Zyrtec/Allegra  C. EpiPen, Benadryl, M.D./ER for allergic reaction  5. Consider a course of immunotherapy  6. Obtain fall flu vaccine  7. Return to clinic in 4 weeks or earlier if problem  Lars MageJuan appears to have multiorgan atopic disease manifested as asthma and allergic rhinoconjunctivitis and food allergy for which we will get him to perform allergen avoidance measures and consistently use anti-inflammatory medications with the therapy specified above. As well, for some of his reflux issues, I think could be best for him to be a little bit more aggressive about addressing this issue by increasing his omeprazole to twice a day and making an effort to limit any caffeine or chocolate or alcohol consumption. He would definitely be a candidate for immunotherapy and I did give him literature on this form of therapy during today's visit. I will regroup with him in approximately 4 weeks to assess his response.  Harry PriestEric J. Acquanetta Cabanilla, MD [facility] [place]

## 2015-10-24 NOTE — Patient Instructions (Addendum)
  1. Allergen avoidance measures  2. Treat and prevent inflammation:   A. continue Flonase one-2 sprays each nostril one time per day  B. continue montelukast 10 mg one tablet one time per day  C. continue Symbicort 160 2 inhalations twice a day  D. start Spiriva 1.25 respimat 2 inhalations one time per day  3. Continue therapy for reflux with omeprazole 20 mg twice a day  4. If needed:   A. Proventil/Pro-air/Ventolin 2 puffs every 4-6 hours if needed  B. Antihistamine - Claritin/Zyrtec/Allegra  C. EpiPen, Benadryl, M.D./ER for allergic reaction  5. Consider a course of immunotherapy  6. Obtain fall flu vaccine  7. Return to clinic in 4 weeks or earlier if problem

## 2015-11-01 ENCOUNTER — Other Ambulatory Visit: Payer: Self-pay | Admitting: Allergy and Immunology

## 2015-11-01 DIAGNOSIS — J309 Allergic rhinitis, unspecified: Principal | ICD-10-CM

## 2015-11-01 DIAGNOSIS — H1010 Acute atopic conjunctivitis, unspecified eye: Secondary | ICD-10-CM

## 2015-11-01 DIAGNOSIS — J3089 Other allergic rhinitis: Secondary | ICD-10-CM | POA: Diagnosis not present

## 2015-11-02 ENCOUNTER — Encounter: Payer: Self-pay | Admitting: *Deleted

## 2015-11-02 DIAGNOSIS — J301 Allergic rhinitis due to pollen: Secondary | ICD-10-CM | POA: Diagnosis not present

## 2015-11-05 ENCOUNTER — Ambulatory Visit: Payer: Federal, State, Local not specified - PPO

## 2015-11-05 ENCOUNTER — Ambulatory Visit (INDEPENDENT_AMBULATORY_CARE_PROVIDER_SITE_OTHER): Payer: Non-veteran care

## 2015-11-05 DIAGNOSIS — J309 Allergic rhinitis, unspecified: Secondary | ICD-10-CM

## 2015-11-05 NOTE — Progress Notes (Signed)
Immunotherapy   Patient Details  Name: Harry Simmons MRN: [id number] Date of Birth: August 31, 1960  11/05/2015  Harry Simmons Came in today to start ITX.   Following schedule: B  Frequency:  1-2 times weekly Epi-Pen:  Patient has an EpiPen and understands its use.  Emergency Action Plan given and discussed.   Consent signed and patient instructions given. Allergy Guidelines reviewed and copy given along with copy of injection room hours.     Nida Boatmanatricia Eliz Nigg 11/05/2015, 9:48 AM

## 2015-11-16 ENCOUNTER — Ambulatory Visit (INDEPENDENT_AMBULATORY_CARE_PROVIDER_SITE_OTHER): Payer: Non-veteran care

## 2015-11-16 DIAGNOSIS — J309 Allergic rhinitis, unspecified: Secondary | ICD-10-CM

## 2015-11-23 ENCOUNTER — Ambulatory Visit: Payer: Self-pay

## 2015-11-23 DIAGNOSIS — J309 Allergic rhinitis, unspecified: Secondary | ICD-10-CM

## 2015-12-04 ENCOUNTER — Ambulatory Visit (INDEPENDENT_AMBULATORY_CARE_PROVIDER_SITE_OTHER): Payer: Non-veteran care

## 2015-12-04 DIAGNOSIS — J309 Allergic rhinitis, unspecified: Secondary | ICD-10-CM | POA: Diagnosis not present

## 2015-12-18 ENCOUNTER — Ambulatory Visit (INDEPENDENT_AMBULATORY_CARE_PROVIDER_SITE_OTHER): Payer: Non-veteran care | Admitting: *Deleted

## 2015-12-18 DIAGNOSIS — H1010 Acute atopic conjunctivitis, unspecified eye: Secondary | ICD-10-CM | POA: Diagnosis not present

## 2015-12-18 DIAGNOSIS — J309 Allergic rhinitis, unspecified: Secondary | ICD-10-CM | POA: Diagnosis not present

## 2015-12-26 ENCOUNTER — Ambulatory Visit (INDEPENDENT_AMBULATORY_CARE_PROVIDER_SITE_OTHER): Payer: Non-veteran care

## 2015-12-26 DIAGNOSIS — J309 Allergic rhinitis, unspecified: Secondary | ICD-10-CM | POA: Diagnosis not present

## 2015-12-26 DIAGNOSIS — H1010 Acute atopic conjunctivitis, unspecified eye: Secondary | ICD-10-CM | POA: Diagnosis not present

## 2016-01-11 ENCOUNTER — Ambulatory Visit (INDEPENDENT_AMBULATORY_CARE_PROVIDER_SITE_OTHER): Payer: Non-veteran care

## 2016-01-11 DIAGNOSIS — J309 Allergic rhinitis, unspecified: Secondary | ICD-10-CM | POA: Diagnosis not present

## 2016-01-11 DIAGNOSIS — H1010 Acute atopic conjunctivitis, unspecified eye: Secondary | ICD-10-CM

## 2016-01-14 ENCOUNTER — Ambulatory Visit (INDEPENDENT_AMBULATORY_CARE_PROVIDER_SITE_OTHER): Payer: Non-veteran care | Admitting: *Deleted

## 2016-01-14 DIAGNOSIS — H1010 Acute atopic conjunctivitis, unspecified eye: Secondary | ICD-10-CM | POA: Diagnosis not present

## 2016-01-14 DIAGNOSIS — J309 Allergic rhinitis, unspecified: Secondary | ICD-10-CM

## 2016-01-21 ENCOUNTER — Ambulatory Visit (INDEPENDENT_AMBULATORY_CARE_PROVIDER_SITE_OTHER): Payer: Non-veteran care | Admitting: *Deleted

## 2016-01-21 DIAGNOSIS — J309 Allergic rhinitis, unspecified: Secondary | ICD-10-CM | POA: Diagnosis not present

## 2016-01-28 ENCOUNTER — Ambulatory Visit (INDEPENDENT_AMBULATORY_CARE_PROVIDER_SITE_OTHER): Payer: Non-veteran care | Admitting: *Deleted

## 2016-01-28 DIAGNOSIS — J309 Allergic rhinitis, unspecified: Secondary | ICD-10-CM

## 2016-01-30 ENCOUNTER — Ambulatory Visit (INDEPENDENT_AMBULATORY_CARE_PROVIDER_SITE_OTHER): Payer: Non-veteran care | Admitting: *Deleted

## 2016-01-30 DIAGNOSIS — J309 Allergic rhinitis, unspecified: Secondary | ICD-10-CM | POA: Diagnosis not present

## 2016-02-07 ENCOUNTER — Ambulatory Visit (INDEPENDENT_AMBULATORY_CARE_PROVIDER_SITE_OTHER): Payer: Non-veteran care

## 2016-02-07 DIAGNOSIS — J309 Allergic rhinitis, unspecified: Secondary | ICD-10-CM | POA: Diagnosis not present

## 2016-02-24 ENCOUNTER — Encounter (HOSPITAL_BASED_OUTPATIENT_CLINIC_OR_DEPARTMENT_OTHER): Payer: Self-pay | Admitting: *Deleted

## 2016-02-24 ENCOUNTER — Emergency Department (HOSPITAL_BASED_OUTPATIENT_CLINIC_OR_DEPARTMENT_OTHER)
Admission: EM | Admit: 2016-02-24 | Discharge: 2016-02-24 | Disposition: A | Discharge status: Home / Self Care | Payer: Federal, State, Local not specified - PPO | Attending: Emergency Medicine | Admitting: Emergency Medicine

## 2016-02-24 DIAGNOSIS — J029 Acute pharyngitis, unspecified: Secondary | ICD-10-CM | POA: Diagnosis present

## 2016-02-24 DIAGNOSIS — Z79899 Other long term (current) drug therapy: Secondary | ICD-10-CM | POA: Insufficient documentation

## 2016-02-24 DIAGNOSIS — J45909 Unspecified asthma, uncomplicated: Secondary | ICD-10-CM | POA: Insufficient documentation

## 2016-02-24 DIAGNOSIS — H9202 Otalgia, left ear: Secondary | ICD-10-CM | POA: Insufficient documentation

## 2016-02-24 MED ORDER — CLINDAMYCIN HCL 300 MG PO CAPS: 300.0000 mg | ORAL_CAPSULE | Freq: Three times a day (TID) | ORAL | 0 refills | Status: DC

## 2016-02-24 MED ORDER — IBUPROFEN 400 MG PO TABS: 400.0000 mg | ORAL_TABLET | Freq: Three times a day (TID) | ORAL | 0 refills | Status: DC | PRN

## 2016-02-24 NOTE — ED Triage Notes (Signed)
Patient c/o sore throat and left ear pain that has grown worse since yesterday. H thinks he was running a fever late yesterday.

## 2016-02-24 NOTE — ED Provider Notes (Signed)
[facility] Provider Note   CSN: [id number] Arrival date & time: 02/24/16  0756     History   Chief Complaint Chief Complaint  Patient presents with  . Otalgia  . Sore Throat    HPI Harry Simmons is a 55 y.o. male.  HPI Patient presents the emergency department with 36 hours of sore throat and left ear pain.  He feels like this is worsened since yesterday.  He reports low-grade fever last night.  No difficulty speaking.  No change in his voice.  No neck stiffness.  No recent dental pain or dental procedure.  Reports painful swallowing.   Past Medical History:  Diagnosis Date  . Allergic rhinitis   . Asthma   . GERD (gastroesophageal reflux disease)   . PTSD (post-traumatic stress disorder)     Patient Active Problem List   Diagnosis Date Noted  . Choledocholithiasis 05/21/2012  . Choledocholithiasis with acute cholecystitis 05/21/2012  . Abdominal pain, epigastric 05/17/2012  . Abnormal liver function tests 05/17/2012    Past Surgical History:  Procedure Laterality Date  . CHOLECYSTECTOMY N/A 05/20/2012   Procedure: LAPAROSCOPIC CHOLECYSTECTOMY WITH INTRAOPERATIVE CHOLANGIOGRAM;  Surgeon: Robyne Askew, MD;  Location: [facility];  Service: General;  Laterality: N/A;  . COLONOSCOPY    . ERCP N/A 05/18/2012   Procedure: ENDOSCOPIC RETROGRADE CHOLANGIOPANCREATOGRAPHY (ERCP);  Surgeon: Theda Belfast, MD;  Location: [facility];  Service: Endoscopy;  Laterality: N/A;       Home Medications    Prior to Admission medications   Medication Sig Start Date End Date Taking? Authorizing Provider  acetaminophen (TYLENOL) 500 MG tablet Take 500 mg by mouth 3 (three) times daily as needed.    Historical Provider, MD  albuterol (PROVENTIL HFA;VENTOLIN HFA) 108 (90 Base) MCG/ACT inhaler Inhale 2 puffs into the lungs every 6 (six) hours as needed. 10/24/15   Jessica Priest, MD  albuterol (PROVENTIL) (2.5 MG/3ML) 0.083% nebulizer solution Take 3 mLs (2.5 mg total) by  nebulization every 6 (six) hours as needed for wheezing or shortness of breath. 08/28/14   Elvina Sidle, MD  budesonide-formoterol [facility]) 160-4.5 MCG/ACT inhaler Inhale 2 puffs into the lungs 2 (two) times daily. 10/24/15   Jessica Priest, MD  buPROPion (WELLBUTRIN SR) 150 MG 12 hr tablet Take 150 mg by mouth daily.    Historical Provider, MD  clindamycin (CLEOCIN) 300 MG capsule Take 1 capsule (300 mg total) by mouth 3 (three) times daily. 02/24/16   Azalia Bilis, MD  clobetasol ointment (TEMOVATE) 0.05 % Apply 1 application topically 2 (two) times daily.    Historical Provider, MD  dibucaine (NUPERCAINAL) 1 % ointment Apply 1 application topically 3 (three) times daily as needed for pain.    Historical Provider, MD  docusate sodium (COLACE) 100 MG capsule Take 200 mg by mouth at bedtime as needed for mild constipation.    Historical Provider, MD  fluticasone (FLONASE) 50 MCG/ACT nasal spray Place 2 sprays into both nostrils daily. 10/24/15   Jessica Priest, MD  hydrocortisone 2.5 % cream Apply 1 application topically 2 (two) times daily.    Historical Provider, MD  ibuprofen (ADVIL,MOTRIN) 400 MG tablet Take 1 tablet (400 mg total) by mouth every 8 (eight) hours as needed. 02/24/16   Azalia Bilis, MD  lidocaine (LMX) 4 % cream Apply 1 application topically 3 (three) times daily as needed.    Historical Provider, MD  loratadine (CLARITIN) 10 MG tablet Take 10 mg by mouth daily as  needed. For allergies    Historical Provider, MD  methocarbamol (ROBAXIN) 750 MG tablet Take 750 mg by mouth at bedtime as needed for muscle spasms.    Historical Provider, MD  montelukast (SINGULAIR) 10 MG tablet Take 1 tablet (10 mg total) by mouth at bedtime. 10/24/15   Jessica PriestEric J Kozlow, MD  omeprazole (PRILOSEC) 20 MG capsule Take one capsule twice daily 10/24/15   Jessica PriestEric J Kozlow, MD  [facility] PADS Apply topically.    Historical Provider, MD  PSYLLIUM PO Take by mouth.    Historical Provider, MD    sertraline (ZOLOFT) 100 MG tablet Take 100 mg by mouth daily.    Historical Provider, MD  sildenafil (VIAGRA) 100 MG tablet Take 100 mg by mouth daily as needed for erectile dysfunction.    Historical Provider, MD  Tiotropium Bromide Monohydrate (SPIRIVA RESPIMAT) 1.25 MCG/ACT AERS Inhale 2 puffs into the lungs daily. 10/24/15   Jessica PriestEric J Kozlow, MD  traMADol (ULTRAM) 50 MG tablet Take 50 mg by mouth 2 (two) times daily.    Historical Provider, MD  traZODone (DESYREL) 50 MG tablet Take 50 mg by mouth at bedtime.    Historical Provider, MD  zolpidem (AMBIEN) 10 MG tablet Take 10 mg by mouth at bedtime as needed for sleep.    Historical Provider, MD    Family History Family History  Problem Relation Age of Onset  . Diabetes Mother   . Hypotension Mother   . Hypertension Father   . CAD Father   . Diabetes Sister   . CAD Sister   . Hypertension Sister   . Allergic rhinitis Sister   . Asthma Sister   . Diabetes Brother     Social History Social History  Substance Use Topics  . Smoking status: Never Smoker  . Smokeless tobacco: Never Used  . Alcohol use No     Allergies   Codeine and Lobster [shellfish allergy]   Review of Systems Review of Systems  All other systems reviewed and are negative.    Physical Exam Updated Vital Signs BP 134/80 (BP Location: Right Arm)   Pulse 74   Temp 98.5 F (36.9 C) (Oral)   Resp 20   Ht 5\' 7"  (1.702 m)   Wt 230 lb (104.3 kg)   SpO2 97%   BMI 36.02 kg/m   Physical Exam  Constitutional: He is oriented to person, place, and time. He appears well-developed and well-nourished.  HENT:  Head: Normocephalic and atraumatic.  Mild erythema of the posterior pharynx.  Uvula midline.  Tolerating secretions.  Oral airway patent.  Bilateral TMs normal  Eyes: EOM are normal.  Neck: Normal range of motion.  Cardiovascular: Normal rate and regular rhythm.   Pulmonary/Chest: Effort normal and breath sounds normal. No respiratory distress.   Abdominal: Soft. He exhibits no distension. There is no tenderness.  Musculoskeletal: Normal range of motion.  Neurological: He is alert and oriented to person, place, and time.  Skin: Skin is warm and dry.  Psychiatric: He has a normal mood and affect. Judgment normal.  Nursing note and vitals reviewed.    ED Treatments / Results  Labs (all labs ordered are listed, but only abnormal results are displayed) Labs Reviewed - No data to display  EKG  EKG Interpretation None       Radiology No results found.  Procedures Procedures (including critical care time)  Medications Ordered in ED Medications - No data to display   Initial Impression /  Assessment and Plan / ED Course  I have reviewed the triage vital signs and the nursing notes.  Pertinent labs & imaging results that were available during my care of the patient were reviewed by me and considered in my medical decision making (see chart for details).  Clinical Course     Patient wil be treated for pharyngitis.  Overall well-appearing.  No signs of peritonsillar abscess  Final Clinical Impressions(s) / ED Diagnoses   Final diagnoses:  Pharyngitis, unspecified etiology    New Prescriptions New Prescriptions   CLINDAMYCIN (CLEOCIN) 300 MG CAPSULE    Take 1 capsule (300 mg total) by mouth 3 (three) times daily.   IBUPROFEN (ADVIL,MOTRIN) 400 MG TABLET    Take 1 tablet (400 mg total) by mouth every 8 (eight) hours as needed.     Azalia BilisKevin Flavia Bruss, MD 02/24/16 [phone]

## 2016-02-25 ENCOUNTER — Ambulatory Visit (INDEPENDENT_AMBULATORY_CARE_PROVIDER_SITE_OTHER): Payer: Non-veteran care | Admitting: *Deleted

## 2016-02-25 DIAGNOSIS — J309 Allergic rhinitis, unspecified: Secondary | ICD-10-CM

## 2016-03-05 ENCOUNTER — Ambulatory Visit (INDEPENDENT_AMBULATORY_CARE_PROVIDER_SITE_OTHER): Payer: Non-veteran care | Admitting: *Deleted

## 2016-03-05 DIAGNOSIS — J309 Allergic rhinitis, unspecified: Secondary | ICD-10-CM | POA: Diagnosis not present

## 2016-03-12 ENCOUNTER — Ambulatory Visit (INDEPENDENT_AMBULATORY_CARE_PROVIDER_SITE_OTHER): Payer: Non-veteran care

## 2016-03-12 DIAGNOSIS — J309 Allergic rhinitis, unspecified: Secondary | ICD-10-CM | POA: Diagnosis not present

## 2016-03-14 ENCOUNTER — Telehealth: Payer: Self-pay

## 2016-03-14 NOTE — Telephone Encounter (Signed)
Left a voicemail to inform patient that I sent a request to the [facility] for more visits on today. Also to inform the patient to [facility] and follow up [facility] the [facility] to see if he can receive a quicker response.

## 2016-03-17 NOTE — Telephone Encounter (Signed)
Patient called back on Friday 03/14/2016 to let me know he will contact the [facility]. I spoke with [facility] nurse An[facility] at [facility] This morning and she gave me the correct fax number to fax to so she can authorize more visits.

## 2016-03-31 ENCOUNTER — Ambulatory Visit (INDEPENDENT_AMBULATORY_CARE_PROVIDER_SITE_OTHER): Payer: Non-veteran care

## 2016-03-31 DIAGNOSIS — J309 Allergic rhinitis, unspecified: Secondary | ICD-10-CM

## 2016-04-04 ENCOUNTER — Ambulatory Visit (INDEPENDENT_AMBULATORY_CARE_PROVIDER_SITE_OTHER): Payer: Non-veteran care

## 2016-04-04 DIAGNOSIS — J309 Allergic rhinitis, unspecified: Secondary | ICD-10-CM

## 2016-04-09 ENCOUNTER — Ambulatory Visit (INDEPENDENT_AMBULATORY_CARE_PROVIDER_SITE_OTHER): Payer: Non-veteran care | Admitting: *Deleted

## 2016-04-09 DIAGNOSIS — J309 Allergic rhinitis, unspecified: Secondary | ICD-10-CM | POA: Diagnosis not present

## 2016-04-15 ENCOUNTER — Ambulatory Visit (INDEPENDENT_AMBULATORY_CARE_PROVIDER_SITE_OTHER): Payer: Non-veteran care

## 2016-04-15 DIAGNOSIS — J309 Allergic rhinitis, unspecified: Secondary | ICD-10-CM | POA: Diagnosis not present

## 2016-04-18 ENCOUNTER — Emergency Department (HOSPITAL_BASED_OUTPATIENT_CLINIC_OR_DEPARTMENT_OTHER): Payer: Federal, State, Local not specified - PPO

## 2016-04-18 ENCOUNTER — Encounter (HOSPITAL_BASED_OUTPATIENT_CLINIC_OR_DEPARTMENT_OTHER): Payer: Self-pay | Admitting: *Deleted

## 2016-04-18 ENCOUNTER — Emergency Department (HOSPITAL_BASED_OUTPATIENT_CLINIC_OR_DEPARTMENT_OTHER)
Admission: EM | Admit: 2016-04-18 | Discharge: 2016-04-18 | Disposition: A | Discharge status: Home / Self Care | Payer: Federal, State, Local not specified - PPO | Attending: Emergency Medicine | Admitting: Emergency Medicine

## 2016-04-18 DIAGNOSIS — B9789 Other viral agents as the cause of diseases classified elsewhere: Secondary | ICD-10-CM

## 2016-04-18 DIAGNOSIS — J45909 Unspecified asthma, uncomplicated: Secondary | ICD-10-CM | POA: Diagnosis not present

## 2016-04-18 DIAGNOSIS — J208 Acute bronchitis due to other specified organisms: Secondary | ICD-10-CM | POA: Diagnosis not present

## 2016-04-18 DIAGNOSIS — J069 Acute upper respiratory infection, unspecified: Secondary | ICD-10-CM | POA: Insufficient documentation

## 2016-04-18 DIAGNOSIS — R05 Cough: Secondary | ICD-10-CM | POA: Diagnosis present

## 2016-04-18 MED ORDER — PREDNISONE 50 MG PO TABS
60.0000 mg | ORAL_TABLET | Freq: Once | ORAL | Status: AC
  Administered 2016-04-18: 60 mg via ORAL
  Filled 2016-04-18: qty 1

## 2016-04-18 MED ORDER — PREDNISONE 10 MG PO TABS: 40.0000 mg | ORAL_TABLET | Freq: Every day | ORAL | 0 refills | Status: AC

## 2016-04-18 MED ORDER — BENZONATATE 100 MG PO CAPS: 100.0000 mg | ORAL_CAPSULE | Freq: Three times a day (TID) | ORAL | 0 refills | Status: DC | PRN

## 2016-04-18 MED FILL — BENZONATATE 100 MG CAP: 100 | 5 days supply | Qty: 15 | Fill #0

## 2016-04-18 MED FILL — predniSONE 10 MG TABS: 10 | 4 days supply | Qty: 16 | Fill #0

## 2016-04-18 NOTE — ED Provider Notes (Signed)
[facility]   CSN: [id number] Arrival date & time: 04/18/16  1140     History   Chief Complaint Chief Complaint  Patient presents with  . Cough    HPI Harry Simmons is a 56 y.o. male.  HPI   Presents with concern for cough for 4 days. Cough severe, coughed up mucous x1, but otherwise as been nonproductive.  Has been wheezing more and needing inhaler more.  No chest pain, no fevers, no body aches, no sore throat.  Has hx of asthma, admissions for asthma in past. No hx of intubation. Wheezing mild in comparison to prior exacerbations, 5/10. Reports improvement with home nebulizer. More concerned regarding cough.  Past Medical History:  Diagnosis Date  . Allergic rhinitis   . Asthma   . GERD (gastroesophageal reflux disease)   . PTSD (post-traumatic stress disorder)     Patient Active Problem List   Diagnosis Date Noted  . Choledocholithiasis 05/21/2012  . Choledocholithiasis with acute cholecystitis 05/21/2012  . Abdominal pain, epigastric 05/17/2012  . Abnormal liver function tests 05/17/2012    Past Surgical History:  Procedure Laterality Date  . CHOLECYSTECTOMY N/A 05/20/2012   Procedure: LAPAROSCOPIC CHOLECYSTECTOMY WITH INTRAOPERATIVE CHOLANGIOGRAM;  Surgeon: Robyne Askew, MD;  Location: [facility];  Service: General;  Laterality: N/A;  . COLONOSCOPY    . ERCP N/A 05/18/2012   Procedure: ENDOSCOPIC RETROGRADE CHOLANGIOPANCREATOGRAPHY (ERCP);  Surgeon: Theda Belfast, MD;  Location: [facility];  Service: Endoscopy;  Laterality: N/A;       Home Medications    Prior to Admission medications   Medication Sig Start Date End Date Taking? Authorizing Provider  acetaminophen (TYLENOL) 500 MG tablet Take 500 mg by mouth 3 (three) times daily as needed.    Historical Provider, MD  albuterol (PROVENTIL HFA;VENTOLIN HFA) 108 (90 Base) MCG/ACT inhaler Inhale 2 puffs into the lungs every 6 (six) hours as needed. 10/24/15   Jessica Priest, MD    albuterol (PROVENTIL) (2.5 MG/3ML) 0.083% nebulizer solution Take 3 mLs (2.5 mg total) by nebulization every 6 (six) hours as needed for wheezing or shortness of breath. 08/28/14   Elvina Sidle, MD  benzonatate (TESSALON) 100 MG capsule Take 1 capsule (100 mg total) by mouth 3 (three) times daily as needed for cough. 04/18/16   Alvira Monday, MD  budesonide-formoterol (SYMBICORT) 160-4.5 MCG/ACT inhaler Inhale 2 puffs into the lungs 2 (two) times daily. 10/24/15   Jessica Priest, MD  buPROPion (WELLBUTRIN SR) 150 MG 12 hr tablet Take 150 mg by mouth daily.    Historical Provider, MD  clindamycin (CLEOCIN) 300 MG capsule Take 1 capsule (300 mg total) by mouth 3 (three) times daily. 02/24/16   Azalia Bilis, MD  clobetasol ointment (TEMOVATE) 0.05 % Apply 1 application topically 2 (two) times daily.    Historical Provider, MD  dibucaine (NUPERCAINAL) 1 % ointment Apply 1 application topically 3 (three) times daily as needed for pain.    Historical Provider, MD  docusate sodium (COLACE) 100 MG capsule Take 200 mg by mouth at bedtime as needed for mild constipation.    Historical Provider, MD  fluticasone (FLONASE) 50 MCG/ACT nasal spray Place 2 sprays into both nostrils daily. 10/24/15   Jessica Priest, MD  hydrocortisone 2.5 % cream Apply 1 application topically 2 (two) times daily.    Historical Provider, MD  ibuprofen (ADVIL,MOTRIN) 400 MG tablet Take 1 tablet (400 mg total) by mouth every 8 (eight) hours as needed. 02/24/16  Kevin Campos, MD  lidocaine (LMX) 4 % cream Apply 1 application topically 3 (three) times daily as needed.    HistoricAzalia Bilisal Provider, MD  loratadine (CLARITIN) 10 MG tablet Take 10 mg by mouth daily as needed. For allergies    Historical Provider, MD  methocarbamol (ROBAXIN) 750 MG tablet Take 750 mg by mouth at bedtime as needed for muscle spasms.    Historical Provider, MD  montelukast (SINGULAIR) 10 MG tablet Take 1 tablet (10 mg total) by mouth at bedtime. 10/24/15   Jessica PriestEric J  Kozlow, MD  omeprazole (PRILOSEC) 20 MG capsule Take one capsule twice daily 10/24/15   Jessica PriestEric J Kozlow, MD  [facility]ED [facility] % PADS Apply topically.    Historical Provider, MD  predniSONE (DELTASONE) 10 MG tablet Take 4 tablets (40 mg total) by mouth daily. 04/18/16 04/22/16  Alvira MondayErin Dawnn Nam, MD  PSYLLIUM PO Take by mouth.    Historical Provider, MD  sertraline (ZOLOFT) 100 MG tablet Take 100 mg by mouth daily.    Historical Provider, MD  sildenafil (VIAGRA) 100 MG tablet Take 100 mg by mouth daily as needed for erectile dysfunction.    Historical Provider, MD  Tiotropium Bromide Monohydrate (SPIRIVA RESPIMAT) 1.25 MCG/ACT AERS Inhale 2 puffs into the lungs daily. 10/24/15   Jessica PriestEric J Kozlow, MD  traMADol (ULTRAM) 50 MG tablet Take 50 mg by mouth 2 (two) times daily.    Historical Provider, MD  traZODone (DESYREL) 50 MG tablet Take 50 mg by mouth at bedtime.    Historical Provider, MD  zolpidem (AMBIEN) 10 MG tablet Take 10 mg by mouth at bedtime as needed for sleep.    Historical Provider, MD    Family History Family History  Problem Relation Age of Onset  . Diabetes Mother   . Hypotension Mother   . Hypertension Father   . CAD Father   . Diabetes Sister   . CAD Sister   . Hypertension Sister   . Allergic rhinitis Sister   . Asthma Sister   . Diabetes Brother     Social History Social History  Substance Use Topics  . Smoking status: Never Smoker  . Smokeless tobacco: Never Used  . Alcohol use No     Allergies   Codeine and Lobster [shellfish allergy]   Review of Systems Review of Systems  Constitutional: Negative for fever.  HENT: Positive for congestion. Negative for sore throat.   Eyes: Negative for visual disturbance.  Respiratory: Positive for cough and wheezing. Negative for shortness of breath.   Cardiovascular: Negative for chest pain.  Gastrointestinal: Negative for abdominal pain.  Genitourinary: Negative for difficulty urinating.  Musculoskeletal:  Negative for back pain and neck stiffness.  Skin: Negative for rash.  Neurological: Negative for syncope and headaches.     Physical Exam Updated Vital Signs BP 121/78 (BP Location: Right Arm)   Pulse 94   Temp 99 F (37.2 C) (Oral)   Resp 16   Ht 5\' 7"  (1.702 m)   Wt 230 lb (104.3 kg)   SpO2 99%   BMI 36.02 kg/m   Physical Exam  Constitutional: He is oriented to person, place, and time. He appears well-developed and well-nourished. No distress.  HENT:  Head: Normocephalic and atraumatic.  Eyes: Conjunctivae and EOM are normal.  Neck: Normal range of motion.  Cardiovascular: Normal rate, regular rhythm, normal heart sounds and intact distal pulses.  Exam reveals no gallop and no friction rub.   No murmur heard. Pulmonary/Chest: Effort normal. No  respiratory distress. He has wheezes (occasional). He has no rales.  Abdominal: Soft. He exhibits no distension. There is no tenderness. There is no guarding.  Musculoskeletal: He exhibits no edema.  Neurological: He is alert and oriented to person, place, and time.  Skin: Skin is warm and dry. He is not diaphoretic.  Nursing note and vitals reviewed.    ED Treatments / Results  Labs (all labs ordered are listed, but only abnormal results are displayed) Labs Reviewed - No data to display  EKG  EKG Interpretation None       Radiology Dg Chest 2 View  Result Date: 04/18/2016 CLINICAL DATA:  Cough and shortness of breath for 4 days. EXAM: CHEST  2 VIEW COMPARISON:  None. FINDINGS: Heart size and pulmonary vascularity are normal. The lungs are clear except for peribronchial thickening. No effusions. No bone abnormality. IMPRESSION: Bronchitic changes. Electronically Signed   By: Francene Boyers M.D.   On: 04/18/2016 12:40    Procedures Procedures (including critical care time)  Medications Ordered in ED Medications  predniSONE (DELTASONE) tablet 60 mg (60 mg Oral Given 04/18/16 1343)     Initial Impression / Assessment  and Plan / ED Course  I have reviewed the triage vital signs and the nursing notes.  Pertinent labs & imaging results that were available during my care of the patient were reviewed by me and considered in my medical decision making (see chart for details).     56yo male with history of asthma, GERD, COPD, presents with concern for cough and congestion. CXR shows no sign of pneumonia.  Occasional wheezing on exam, bronchitic cough, no respiratory distress.  No CP, no fevers, no myalgias, doubt influenza/ACS/PE.  Pt given prednisone in ED, rx for same for treatment of asthma/viral bronchitis. Patient discharged in stable condition with understanding of reasons to return.   Final Clinical Impressions(s) / ED Diagnoses   Final diagnoses:  Viral URI with cough  Viral bronchitis    New Prescriptions Discharge Medication List as of 04/18/2016  1:18 PM    START taking these medications   Details  predniSONE (DELTASONE) 10 MG tablet Take 4 tablets (40 mg total) by mouth daily., Starting Fri 04/18/2016, Until Tue 04/22/2016, Print         Alvira Monday, MD 04/18/16 [phone]

## 2016-04-18 NOTE — ED Triage Notes (Signed)
Cough x 4 days

## 2016-04-24 ENCOUNTER — Ambulatory Visit (INDEPENDENT_AMBULATORY_CARE_PROVIDER_SITE_OTHER): Payer: Non-veteran care | Admitting: *Deleted

## 2016-04-24 DIAGNOSIS — J309 Allergic rhinitis, unspecified: Secondary | ICD-10-CM | POA: Diagnosis not present

## 2016-04-28 ENCOUNTER — Ambulatory Visit (INDEPENDENT_AMBULATORY_CARE_PROVIDER_SITE_OTHER): Payer: Non-veteran care

## 2016-04-28 DIAGNOSIS — J309 Allergic rhinitis, unspecified: Secondary | ICD-10-CM

## 2016-05-07 ENCOUNTER — Ambulatory Visit (INDEPENDENT_AMBULATORY_CARE_PROVIDER_SITE_OTHER): Payer: Non-veteran care | Admitting: *Deleted

## 2016-05-07 DIAGNOSIS — J309 Allergic rhinitis, unspecified: Secondary | ICD-10-CM | POA: Diagnosis not present

## 2016-05-12 ENCOUNTER — Ambulatory Visit (INDEPENDENT_AMBULATORY_CARE_PROVIDER_SITE_OTHER): Payer: Non-veteran care | Admitting: *Deleted

## 2016-05-12 DIAGNOSIS — J309 Allergic rhinitis, unspecified: Secondary | ICD-10-CM

## 2016-05-20 ENCOUNTER — Ambulatory Visit (INDEPENDENT_AMBULATORY_CARE_PROVIDER_SITE_OTHER): Payer: Non-veteran care | Admitting: *Deleted

## 2016-05-20 DIAGNOSIS — J309 Allergic rhinitis, unspecified: Secondary | ICD-10-CM

## 2016-05-26 ENCOUNTER — Ambulatory Visit (INDEPENDENT_AMBULATORY_CARE_PROVIDER_SITE_OTHER): Payer: Non-veteran care | Admitting: *Deleted

## 2016-05-26 DIAGNOSIS — J309 Allergic rhinitis, unspecified: Secondary | ICD-10-CM | POA: Diagnosis not present

## 2016-05-29 ENCOUNTER — Ambulatory Visit (INDEPENDENT_AMBULATORY_CARE_PROVIDER_SITE_OTHER): Payer: Non-veteran care | Admitting: Allergy & Immunology

## 2016-05-29 ENCOUNTER — Ambulatory Visit: Payer: Self-pay | Admitting: *Deleted

## 2016-05-29 VITALS — BP 110/68 | HR 62 | Resp 14

## 2016-05-29 DIAGNOSIS — T781XXD Other adverse food reactions, not elsewhere classified, subsequent encounter: Secondary | ICD-10-CM

## 2016-05-29 DIAGNOSIS — J4540 Moderate persistent asthma, uncomplicated: Secondary | ICD-10-CM

## 2016-05-29 DIAGNOSIS — J3089 Other allergic rhinitis: Secondary | ICD-10-CM

## 2016-05-29 DIAGNOSIS — T782XXA Anaphylactic shock, unspecified, initial encounter: Secondary | ICD-10-CM

## 2016-05-29 DIAGNOSIS — J309 Allergic rhinitis, unspecified: Secondary | ICD-10-CM

## 2016-05-29 MED ORDER — EPINEPHRINE (ANAPHYLAXIS) 1 MG/ML IJ SOLN
0.3000 mg | Freq: Once | INTRAMUSCULAR | Status: AC
  Administered 2016-05-29: 0.3 mg via INTRAMUSCULAR

## 2016-05-29 MED ORDER — EPINEPHRINE PF 1 MG/ML IJ SOLN: 0.3000 mL | Freq: Once | INTRAMUSCULAR | Status: DC

## 2016-05-29 NOTE — Progress Notes (Signed)
Please have patient make an appointment with me next week.

## 2016-05-29 NOTE — Progress Notes (Signed)
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 - Take another 60mg  prednisone tomorrow to prevent late-phase reactions. - Take Zyrtec (cetirizine) 10mg  twice daily for the next five days. - We will talk to Harry Simmons and adjust your allergen injections accordingly. - Come back next week for your next injection. - I anticipate that we will take him down to his first dose of the Red Vial and change him to Schedule A, however I will refer to Harry Simmons.   2. Moderate persistent asthma - Continue with the current medication regimen. - We will not make changes today. - Daily controller medication(s): Symbicort 160/4.5 two puffs twice daily + Spiriva two inhalations once daily + Singulair 10mg  daily - Rescue medications: ProAir 4 puffs every 4-6 hours as needed - Asthma control goals:  * Full participation in all desired activities (may need albuterol before activity) * Albuterol use two time or less a week on average (not counting use with activity) * Cough interfering with sleep two time or less a month * Oral steroids no more than once a year * No hospitalizations  3. Shellfish allergy - Continue to avoid shellfish. - EpiPen is up to date.  4. Return in about 3 months (around 08/29/2016).     Subjective:   Harry Simmons is a 56 y.o. male presenting today for follow up of  Chief Complaint  Patient presents with  . Allergic Reaction    Harry Simmons has a history of the following: Patient Active  Problem List   Diagnosis Date Noted  . Choledocholithiasis 05/21/2012  . Choledocholithiasis with acute cholecystitis 05/21/2012  . Abdominal pain, epigastric 05/17/2012  . Abnormal liver function tests 05/17/2012    History obtained from: chart review and patient.  Harry Simmons was referred by Pcp Not In System.     Harry Simmons is a 56 y.o. male presenting for a shot reaction. He was last seen in August 2017. At that time, he was referred for allergy testing and initiation of immunotherapy. Testing at that time noted sensitivities against grasses, weeds, trees, dust mite, cat, dog, cockroach, as well as shellfish. He was continued on Symbicort 162 inhalations in the morning and 2 inhalations at night. He was started on Spiriva 2 inhalations once daily. He was continued on Singulair 10 mg daily as well as Flonase daily.  This morning he received his injection of 0.60mL of two vials: #1 contains weed and dust mites while #2 contains grasses, trees, cat, and dog. He tolerated the injections at first without a problem. However around one hour after the injections he developed rashes on his bilateral arms. When he started itching, he turned around and came back. When he got here he was having mouth tingling and swelling. He did start feeling shortness of breath as well.   When he arrived, we brought him back to the room immediately. We took his vitals and my physical exam noted wheezing in all lung fields as well as urticaria on his bilateral arms. He did have a quarter sized swelling  on his left arm (dust mite and weed vial). We immediately gave 0.3mg  epinephrine, 60mg  prednisone, and a DuoNeb nebulizer treatment. He was laid down on an exam table and his legs were elevated above the rest of his body. Vitals were monitored every five minutes for one hour.  Otherwise, there have been no changes to his past medical history, surgical history, family history, or social history.    Review of Systems: a  14-point review of systems is pertinent for what is mentioned in HPI.  Otherwise, all other systems were negative. Constitutional: negative other than that listed in the HPI Eyes: negative other than that listed in the HPI Ears, nose, mouth, throat, and face: negative other than that listed in the HPI Respiratory: negative other than that listed in the HPI Cardiovascular: negative other than that listed in the HPI Gastrointestinal: negative other than that listed in the HPI Genitourinary: negative other than that listed in the HPI Integument: negative other than that listed in the HPI Hematologic: negative other than that listed in the HPI Musculoskeletal: negative other than that listed in the HPI Neurological: negative other than that listed in the HPI Allergy/Immunologic: negative other than that listed in the HPI    Objective:   There were no vitals taken for this visit. There is no height or weight on file to calculate BMI.   Physical Exam:  General: Alert, interactive, in no acute distress. Talking in full sentences.  Eyes: No conjunctival injection present on the right, No conjunctival injection present on the left, PERRL bilaterally, No discharge on the right, No discharge on the left and No Horner-Trantas dots present Nose/Throat: External nose within normal limits, nasal crease present and septum midline, turbinates edematous with clear discharge, post-pharynx erythematous without cobblestoning in the posterior oropharynx. Tonsils unremarklable without exudates. There was some mild uvular swelling noted.  Neck: Supple without thyromegaly. Lungs: Mildly decreased breath sounds with expiratory wheezing bilaterally. Increased work of breathing. CV: Normal S1/S2, no murmurs. Capillary refill <2 seconds.  Skin: Scattered erythematous urticarial type lesions primarily located bilateral arms , nonvesicular. Neuro:   Grossly intact. No focal deficits appreciated. Responsive to  questions.   Diagnostic studies: none  10:28 - Harry Simmons was wheezing throughout all lung fields, therefore we gave him a DuoNeb treatment.   10:55 - Harry Simmons had additional wheezing noted following his first nebulizer treatment. Therefore we gave him another DuoNeb treatment.    Malachi BondsJoel Yamila Cragin, MD FAAAAI [facility] [place]

## 2016-05-29 NOTE — Patient Instructions (Addendum)
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 - Take another 60mg  prednisone tomorrow to prevent late-phase reactions. - Take Zyrtec (cetirizine) 10mg  twice daily for the next five days. - We will talk to Dr. Lucie LeatherKozlow and adjust your allergen injections accordingly. - Come back next week for your next injection.   2. Moderate persistent asthma - Continue with the current medication regimen. - We will not make changes today. - Daily controller medication(s): Symbicort 160/4.5 two puffs twice daily + Spiriva two inhalations once daily + Singulair 10mg  daily - Rescue medications: ProAir 4 puffs every 4-6 hours as needed - Asthma control goals:  * Full participation in all desired activities (may need albuterol before activity) * Albuterol use two time or less a week on average (not counting use with activity) * Cough interfering with sleep two time or less a month * Oral steroids no more than once a year * No hospitalizations  3. Shellfish allergy - Continue to avoid shellfish. - EpiPen is up to date.  4. Return in about 3 months (around 08/29/2016).  Please inform us of any Emergency Department visits, hospitalizations, or changes in symptoms. Call us before going to the ED for breathing or allergy symptoms since we might be able to fit you in for a sick visit. Feel free to contact us anytime with any questions, problems, or concerns.  It was a pleasure to meet you today! Happy spring! Call us with questions or concerns.   Websites that have reliable patient information: 1. American Academy of Asthma, Allergy, and Immunology: www.aaaai.org 2. Food Allergy Research and Education (FARE): foodallergy.org 3. Mothers of Asthmatics: http://www.asthmacommunitynetwork.org 4. American College of Allergy, Asthma, and Immunology: www.acaai.org

## 2016-06-05 ENCOUNTER — Ambulatory Visit: Payer: Self-pay | Admitting: *Deleted

## 2016-06-05 ENCOUNTER — Telehealth: Payer: Self-pay | Admitting: *Deleted

## 2016-06-05 NOTE — Telephone Encounter (Signed)
Please have patient make an appointment with me next week (second request) . No more immunotherapy until visit.

## 2016-06-05 NOTE — Telephone Encounter (Signed)
Patient had an injection on 05/29/16 he received Red 1:100 - 0.20 in both arms. Approximately 1 hour after his injection patient returned to the office having a severe reactions. Patient had hives on both arms, mouth tingling and swelling, shortness of breath and wheezing. Patient received Epi in the office and Prednisone and a Duoneb. Please advise on next step for injections.

## 2016-06-05 NOTE — Telephone Encounter (Signed)
Patient has been advised. [place] being [place] next [place].

## 2016-06-10 ENCOUNTER — Encounter: Payer: Self-pay | Admitting: Allergy and Immunology

## 2016-06-10 ENCOUNTER — Telehealth: Payer: Self-pay | Admitting: *Deleted

## 2016-06-10 ENCOUNTER — Ambulatory Visit (INDEPENDENT_AMBULATORY_CARE_PROVIDER_SITE_OTHER): Payer: Non-veteran care | Admitting: Allergy and Immunology

## 2016-06-10 VITALS — BP 130/82 | HR 80 | Resp 20

## 2016-06-10 DIAGNOSIS — Z91018 Allergy to other foods: Secondary | ICD-10-CM | POA: Diagnosis not present

## 2016-06-10 DIAGNOSIS — J3089 Other allergic rhinitis: Secondary | ICD-10-CM

## 2016-06-10 DIAGNOSIS — K219 Gastro-esophageal reflux disease without esophagitis: Secondary | ICD-10-CM | POA: Diagnosis not present

## 2016-06-10 DIAGNOSIS — J4540 Moderate persistent asthma, uncomplicated: Secondary | ICD-10-CM | POA: Diagnosis not present

## 2016-06-10 NOTE — Telephone Encounter (Signed)
Note made on injection record.

## 2016-06-10 NOTE — Progress Notes (Signed)
Follow-up Note  Referring Provider: No ref. provider found Primary Provider: Pcp Not In System Date of Office Visit: 06/10/2016  Subjective:   Harry Simmons (DOB: 1960/07/15) is a 56 y.o. male [facility] in re-evaluation of the following:  HPI: Harry Simmons presents to this clinic in reevaluation of a immunotherapy reaction that appeared to occur on 05/29/2016. Apparently within 30 minutes of receiving immunotherapy he developed a very severe allergic reaction associated with nasal and chest issues requiring the administration of epinephrine in our clinic. He did well with treatment and was given systemic steroids and has had no lingering problems as a result of that reaction. He has not restarted his immunotherapy yet.  It should be noted that he had a "head cold" starting 1 week prior to his immunotherapy reaction. There was no other obvious trigger giving rise to increased immunological hyperreactivity state.  His asthma and allergic rhinitis and history of food allergy directed against shellfish and gastroesophageal reflux disease is under very good control on his current medical therapy which includes a large collection of anti-inflammatory medications for his respiratory tract and continued therapy with a proton pump inhibitor twice a day. Rarely does he use a short acting bronchodilator averaging out one time per week and he can exercise without any difficulty and he has not required a systemic steroid or antibiotic to treat any respiratory tract issue since I last saw him in this clinic in August 2017.  Allergies as of 06/10/2016      Reactions   Codeine Hives, Rash   Tolerates Dilaudid.   Lobster [shellfish Allergy] Shortness Of Breath   Patient stated not all shellfish just lobster      Medication List      acetaminophen 500 MG tablet Commonly known as:  TYLENOL Take 500 mg by mouth 3 (three) times daily as needed.   albuterol  (2.5 MG/3ML) 0.083% nebulizer solution Commonly known as:  PROVENTIL Take 3 mLs (2.5 mg total) by nebulization every 6 (six) hours as needed for wheezing or shortness of breath.   albuterol 108 (90 Base) MCG/ACT inhaler Commonly known as:  PROVENTIL HFA;VENTOLIN HFA Inhale 2 puffs into the lungs every 6 (six) hours as needed.   benzonatate 100 MG capsule Commonly known as:  TESSALON Take 1 capsule (100 mg total) by mouth 3 (three) times daily as needed for cough.   budesonide-formoterol 160-4.5 MCG/ACT inhaler Commonly known as:  SYMBICORT Inhale 2 puffs into the lungs 2 (two) times daily.   buPROPion 150 MG 12 hr tablet Commonly known as:  WELLBUTRIN SR Take 150 mg by mouth daily.   clindamycin 300 MG capsule Commonly known as:  CLEOCIN Take 1 capsule (300 mg total) by mouth 3 (three) times daily.   clobetasol ointment 0.05 % Commonly known as:  TEMOVATE Apply 1 application topically 2 (two) times daily.   dibucaine 1 % ointment Commonly known as:  NUPERCAINAL Apply 1 application topically 3 (three) times daily as needed for pain.   docusate sodium 100 MG capsule Commonly known as:  COLACE Take 200 mg by mouth at bedtime as needed for mild constipation.   fluticasone 50 MCG/ACT nasal spray Commonly known as:  FLONASE Place 2 sprays into both nostrils daily.   hydrocortisone 2.5 % cream Apply 1 application topically 2 (two) times daily.   ibuprofen 400 MG tablet Commonly known as:  ADVIL,MOTRIN Take 1 tablet (400 mg total) by mouth every 8 (  eight) hours as needed.   lidocaine 4 % cream Commonly known as:  LMX Apply 1 application topically 3 (three) times daily as needed.   loratadine 10 MG tablet Commonly known as:  CLARITIN Take 10 mg by mouth daily as needed. For allergies   methocarbamol 750 MG tablet Commonly known as:  ROBAXIN Take 750 mg by mouth at bedtime as needed for muscle spasms.   montelukast 10 MG tablet Commonly known as:  SINGULAIR Take 1  tablet (10 mg total) by mouth at bedtime.   omeprazole 20 MG capsule Commonly known as:  PRILOSEC Take one capsule twice daily   PRE-MOISTENED WITCH HAZEL 50 % Pads Apply topically.   PSYLLIUM PO Take by mouth.   sertraline 100 MG tablet Commonly known as:  ZOLOFT Take 100 mg by mouth daily.   sildenafil 100 MG tablet Commonly known as:  VIAGRA Take 100 mg by mouth daily as needed for erectile dysfunction.   Tiotropium Bromide Monohydrate 1.25 MCG/ACT Aers Commonly known as:  SPIRIVA RESPIMAT Inhale 2 puffs into the lungs daily.   traMADol 50 MG tablet Commonly known as:  ULTRAM Take 50 mg by mouth 2 (two) times daily.   traZODone 50 MG tablet Commonly known as:  DESYREL Take 50 mg by mouth at bedtime.   zolpidem 10 MG tablet Commonly known as:  AMBIEN Take 10 mg by mouth at bedtime as needed for sleep.       Past Medical History:  Diagnosis Date  . Allergic rhinitis   . Asthma   . GERD (gastroesophageal reflux disease)   . PTSD (post-traumatic stress disorder)     Past Surgical History:  Procedure Laterality Date  . CHOLECYSTECTOMY N/A 05/20/2012   Procedure: LAPAROSCOPIC CHOLECYSTECTOMY WITH INTRAOPERATIVE CHOLANGIOGRAM;  Surgeon: Robyne Askew, MD;  Location: [facility];  Service: General;  Laterality: N/A;  . COLONOSCOPY    . ERCP N/A 05/18/2012   Procedure: ENDOSCOPIC RETROGRADE CHOLANGIOPANCREATOGRAPHY (ERCP);  Surgeon: Theda Belfast, MD;  Location: [facility];  Service: Endoscopy;  Laterality: N/A;    Review of systems negative except as noted in HPI / PMHx or noted below:  Review of Systems  Constitutional: Negative.   HENT: Negative.   Eyes: Negative.   Respiratory: Negative.   Cardiovascular: Negative.   Gastrointestinal: Negative.   Genitourinary: Negative.   Musculoskeletal: Negative.   Skin: Negative.   Neurological: Negative.   Endo/Heme/Allergies: Negative.   Psychiatric/Behavioral: Negative.      Objective:   Vitals:    06/10/16 1010  BP: 130/82  Pulse: 80  Resp: 20          Physical Exam  Constitutional: He is well-developed, well-nourished, and in no distress.  HENT:  Head: Normocephalic.  Right Ear: Tympanic membrane, external ear and ear canal normal.  Left Ear: Tympanic membrane, external ear and ear canal normal.  Nose: Nose normal. No mucosal edema or rhinorrhea.  Mouth/Throat: Uvula is midline, oropharynx is clear and moist and mucous membranes are normal. No oropharyngeal exudate.  Eyes: Conjunctivae are normal.  Neck: Trachea normal. No tracheal tenderness present. No tracheal deviation present. No thyromegaly present.  Cardiovascular: Normal rate, regular rhythm, S1 normal, S2 normal and normal heart sounds.   No murmur heard. Pulmonary/Chest: Breath sounds normal. No stridor. No respiratory distress. He has no wheezes. He has no rales.  Musculoskeletal: He exhibits no edema.  Lymphadenopathy:       Head (right side): No tonsillar adenopathy present.  Head (left side): No tonsillar adenopathy present.    He has no cervical adenopathy.  Neurological: He is alert. Gait normal.  Skin: No rash noted. He is not diaphoretic. No erythema. Nails show no clubbing.  Psychiatric: Mood and affect normal.    Diagnostics:    Spirometry was performed and demonstrated an FEV1 of 2.31 at 85 % of predicted.  Assessment and Plan:   1. Asthma, moderate persistent, well-controlled   2. Other allergic rhinitis   3. Gastroesophageal reflux disease, esophagitis presence not specified   4. Food allergy     1. Allergen avoidance measures  2. Treat and prevent inflammation:   A. Flonase one-2 sprays each nostril one time per day  B. montelukast 10 mg one tablet one time per day  C. Symbicort 160 2 inhalations twice a day  D. Spiriva 1.25 respimat 2 inhalations one time per day  3. Continue therapy for reflux with omeprazole 20 mg twice a day  4. If needed:   A. Proventil/Pro-air/Ventolin  2 puffs every 4-6 hours if needed  B. Antihistamine - Claritin/Zyrtec/Allegra  C. EpiPen, Benadryl, M.D./ER for allergic reaction  5. Continue immunotherapy and Epi-Pen. Take Cetirizine  tablet on morning of immunotherapy.  6. Return to clinic in 6 months or earlier if problem  Harry Simmons will restart immunotherapy at 50% of his previous dose and build up to his maintenance dosage while utilizing a antihistamine on the morning of his immunotherapy. I suspect that it was the viral respiratory tract infection I got his immune system overactive and contributed to his episode of anaphylaxis when he had exposure to his allergen injection. He will continue on anti-inflammatory agents for his respiratory tract as noted above and continue to treat his reflux as noted above and I will see him back in this clinic in 6 months or earlier if there is a problem.  Laurette Schimke, MD Allergy / Immunology [facility]

## 2016-06-10 NOTE — Patient Instructions (Addendum)
  1. Allergen avoidance measures  2. Treat and prevent inflammation:   A. Flonase one-2 sprays each nostril one time per day  B. montelukast 10 mg one tablet one time per day  C. Symbicort 160 2 inhalations twice a day  D. Spiriva 1.25 respimat 2 inhalations one time per day  3. Continue therapy for reflux with omeprazole 20 mg twice a day  4. If needed:   A. Proventil/Pro-air/Ventolin 2 puffs every 4-6 hours if needed  B. Antihistamine - Claritin/Zyrtec/Allegra  C. EpiPen, Benadryl, M.D./ER for allergic reaction  5. Continue immunotherapy and Epi-Pen. Take Cetirizine  tablet on morning of immunotherapy.  6. Return to clinic in 6 months or earlier if problem

## 2016-06-10 NOTE — Telephone Encounter (Signed)
-----   Message from Jessica Priest, MD sent at 06/10/2016 12:19 PM EDT ----- Please have patient restart immunotherapy at 50% previous dose. Make sure he has taken an antihistamine prior to immunotherapy.

## 2016-06-13 ENCOUNTER — Ambulatory Visit (INDEPENDENT_AMBULATORY_CARE_PROVIDER_SITE_OTHER): Payer: Non-veteran care

## 2016-06-13 DIAGNOSIS — J309 Allergic rhinitis, unspecified: Secondary | ICD-10-CM

## 2016-06-17 ENCOUNTER — Ambulatory Visit: Payer: Non-veteran care | Admitting: Allergy and Immunology

## 2016-06-20 ENCOUNTER — Ambulatory Visit (INDEPENDENT_AMBULATORY_CARE_PROVIDER_SITE_OTHER): Payer: Non-veteran care | Admitting: *Deleted

## 2016-06-20 DIAGNOSIS — J309 Allergic rhinitis, unspecified: Secondary | ICD-10-CM | POA: Diagnosis not present

## 2016-06-20 MED ORDER — EPINEPHRINE 0.3 MG/0.3ML IJ SOAJ
INTRAMUSCULAR | 1 refills | Status: DC
Start: 2016-06-20 — End: 2019-05-31

## 2016-06-25 ENCOUNTER — Ambulatory Visit (INDEPENDENT_AMBULATORY_CARE_PROVIDER_SITE_OTHER): Payer: Non-veteran care

## 2016-06-25 DIAGNOSIS — J309 Allergic rhinitis, unspecified: Secondary | ICD-10-CM | POA: Diagnosis not present

## 2016-07-02 ENCOUNTER — Ambulatory Visit (INDEPENDENT_AMBULATORY_CARE_PROVIDER_SITE_OTHER): Payer: Non-veteran care | Admitting: *Deleted

## 2016-07-02 DIAGNOSIS — J309 Allergic rhinitis, unspecified: Secondary | ICD-10-CM | POA: Diagnosis not present

## 2016-07-11 ENCOUNTER — Ambulatory Visit (INDEPENDENT_AMBULATORY_CARE_PROVIDER_SITE_OTHER): Payer: Non-veteran care

## 2016-07-11 DIAGNOSIS — J309 Allergic rhinitis, unspecified: Secondary | ICD-10-CM

## 2016-07-21 ENCOUNTER — Ambulatory Visit (INDEPENDENT_AMBULATORY_CARE_PROVIDER_SITE_OTHER): Payer: Non-veteran care

## 2016-07-21 DIAGNOSIS — J309 Allergic rhinitis, unspecified: Secondary | ICD-10-CM

## 2016-07-23 ENCOUNTER — Encounter: Payer: Self-pay | Admitting: *Deleted

## 2016-07-23 NOTE — Progress Notes (Signed)
Maintenance vials made  

## 2016-07-24 DIAGNOSIS — J301 Allergic rhinitis due to pollen: Secondary | ICD-10-CM | POA: Diagnosis not present

## 2016-07-25 DIAGNOSIS — J3089 Other allergic rhinitis: Secondary | ICD-10-CM | POA: Diagnosis not present

## 2016-07-28 ENCOUNTER — Ambulatory Visit (INDEPENDENT_AMBULATORY_CARE_PROVIDER_SITE_OTHER): Payer: Non-veteran care | Admitting: *Deleted

## 2016-07-28 DIAGNOSIS — J309 Allergic rhinitis, unspecified: Secondary | ICD-10-CM | POA: Diagnosis not present

## 2016-08-05 ENCOUNTER — Ambulatory Visit (INDEPENDENT_AMBULATORY_CARE_PROVIDER_SITE_OTHER): Payer: Non-veteran care

## 2016-08-05 DIAGNOSIS — J309 Allergic rhinitis, unspecified: Secondary | ICD-10-CM | POA: Diagnosis not present

## 2016-08-15 ENCOUNTER — Ambulatory Visit (INDEPENDENT_AMBULATORY_CARE_PROVIDER_SITE_OTHER): Payer: Non-veteran care

## 2016-08-15 DIAGNOSIS — J309 Allergic rhinitis, unspecified: Secondary | ICD-10-CM | POA: Diagnosis not present

## 2016-08-25 ENCOUNTER — Ambulatory Visit (INDEPENDENT_AMBULATORY_CARE_PROVIDER_SITE_OTHER): Payer: Non-veteran care | Admitting: *Deleted

## 2016-08-25 DIAGNOSIS — J309 Allergic rhinitis, unspecified: Secondary | ICD-10-CM

## 2016-09-01 ENCOUNTER — Ambulatory Visit (INDEPENDENT_AMBULATORY_CARE_PROVIDER_SITE_OTHER): Payer: Non-veteran care | Admitting: *Deleted

## 2016-09-01 DIAGNOSIS — J309 Allergic rhinitis, unspecified: Secondary | ICD-10-CM | POA: Diagnosis not present

## 2016-09-03 ENCOUNTER — Telehealth: Payer: Self-pay

## 2016-09-03 NOTE — Telephone Encounter (Signed)
[facility] [place] for OV's and Injections.

## 2016-09-12 ENCOUNTER — Ambulatory Visit (INDEPENDENT_AMBULATORY_CARE_PROVIDER_SITE_OTHER): Payer: Non-veteran care

## 2016-09-12 DIAGNOSIS — J309 Allergic rhinitis, unspecified: Secondary | ICD-10-CM

## 2016-09-15 ENCOUNTER — Telehealth: Payer: Self-pay

## 2016-09-15 NOTE — Telephone Encounter (Signed)
Patients referral expires on tomorrow. I sent a referral request back on 09/03/2016 but never heard anything back from [facility]. Spoke to [place] at the [facility] and they denied his authorization request due to him needing his yearly consult visit with the [facility] before they will approve of anymore visits. [facility] we can let the patient come in tomorrow for his shot. I have spoke to the patient and let him know what [facility] instructed and also Beth. Patient agreed he will come in tomorrow morning for his injection.  Thanks

## 2016-09-16 ENCOUNTER — Ambulatory Visit (INDEPENDENT_AMBULATORY_CARE_PROVIDER_SITE_OTHER): Payer: Non-veteran care | Admitting: *Deleted

## 2016-09-16 DIAGNOSIS — J309 Allergic rhinitis, unspecified: Secondary | ICD-10-CM | POA: Diagnosis not present

## 2016-10-10 NOTE — Telephone Encounter (Signed)
Followed up with Mr. Seaberry, he stated he went to the [facility] but he hasn't heard anything else. He is going to give them a call and follow back up.

## 2016-10-27 ENCOUNTER — Ambulatory Visit (INDEPENDENT_AMBULATORY_CARE_PROVIDER_SITE_OTHER): Payer: Non-veteran care | Admitting: *Deleted

## 2016-10-27 DIAGNOSIS — J309 Allergic rhinitis, unspecified: Secondary | ICD-10-CM | POA: Diagnosis not present

## 2016-11-04 ENCOUNTER — Ambulatory Visit (INDEPENDENT_AMBULATORY_CARE_PROVIDER_SITE_OTHER): Payer: Non-veteran care | Admitting: *Deleted

## 2016-11-04 DIAGNOSIS — J309 Allergic rhinitis, unspecified: Secondary | ICD-10-CM | POA: Diagnosis not present

## 2016-11-11 ENCOUNTER — Ambulatory Visit (INDEPENDENT_AMBULATORY_CARE_PROVIDER_SITE_OTHER): Payer: Non-veteran care

## 2016-11-11 DIAGNOSIS — J309 Allergic rhinitis, unspecified: Secondary | ICD-10-CM

## 2016-11-26 ENCOUNTER — Ambulatory Visit (INDEPENDENT_AMBULATORY_CARE_PROVIDER_SITE_OTHER): Payer: Non-veteran care

## 2016-11-26 DIAGNOSIS — J309 Allergic rhinitis, unspecified: Secondary | ICD-10-CM

## 2016-12-02 ENCOUNTER — Ambulatory Visit (INDEPENDENT_AMBULATORY_CARE_PROVIDER_SITE_OTHER): Payer: Non-veteran care | Admitting: *Deleted

## 2016-12-02 DIAGNOSIS — J309 Allergic rhinitis, unspecified: Secondary | ICD-10-CM | POA: Diagnosis not present

## 2016-12-18 ENCOUNTER — Ambulatory Visit (INDEPENDENT_AMBULATORY_CARE_PROVIDER_SITE_OTHER): Payer: Non-veteran care

## 2016-12-18 DIAGNOSIS — J309 Allergic rhinitis, unspecified: Secondary | ICD-10-CM

## 2016-12-23 ENCOUNTER — Ambulatory Visit (INDEPENDENT_AMBULATORY_CARE_PROVIDER_SITE_OTHER): Payer: Non-veteran care | Admitting: *Deleted

## 2016-12-23 DIAGNOSIS — J309 Allergic rhinitis, unspecified: Secondary | ICD-10-CM

## 2016-12-25 ENCOUNTER — Encounter: Payer: Self-pay | Admitting: *Deleted

## 2016-12-25 DIAGNOSIS — J3089 Other allergic rhinitis: Secondary | ICD-10-CM | POA: Diagnosis not present

## 2016-12-25 NOTE — Progress Notes (Signed)
maintenance vials made  

## 2016-12-26 DIAGNOSIS — J3089 Other allergic rhinitis: Secondary | ICD-10-CM | POA: Diagnosis not present

## 2017-01-05 ENCOUNTER — Ambulatory Visit (INDEPENDENT_AMBULATORY_CARE_PROVIDER_SITE_OTHER): Payer: Non-veteran care | Admitting: *Deleted

## 2017-01-05 DIAGNOSIS — J309 Allergic rhinitis, unspecified: Secondary | ICD-10-CM

## 2017-01-13 ENCOUNTER — Ambulatory Visit (INDEPENDENT_AMBULATORY_CARE_PROVIDER_SITE_OTHER): Payer: Non-veteran care | Admitting: *Deleted

## 2017-01-13 DIAGNOSIS — J309 Allergic rhinitis, unspecified: Secondary | ICD-10-CM

## 2017-01-28 ENCOUNTER — Ambulatory Visit (INDEPENDENT_AMBULATORY_CARE_PROVIDER_SITE_OTHER): Payer: Non-veteran care

## 2017-01-28 DIAGNOSIS — J309 Allergic rhinitis, unspecified: Secondary | ICD-10-CM | POA: Diagnosis not present

## 2017-02-02 ENCOUNTER — Ambulatory Visit (INDEPENDENT_AMBULATORY_CARE_PROVIDER_SITE_OTHER): Payer: Non-veteran care

## 2017-02-02 DIAGNOSIS — J309 Allergic rhinitis, unspecified: Secondary | ICD-10-CM

## 2017-02-10 ENCOUNTER — Ambulatory Visit (INDEPENDENT_AMBULATORY_CARE_PROVIDER_SITE_OTHER): Payer: Non-veteran care | Admitting: *Deleted

## 2017-02-10 DIAGNOSIS — J309 Allergic rhinitis, unspecified: Secondary | ICD-10-CM | POA: Diagnosis not present

## 2017-03-17 ENCOUNTER — Ambulatory Visit (INDEPENDENT_AMBULATORY_CARE_PROVIDER_SITE_OTHER): Payer: Non-veteran care | Admitting: *Deleted

## 2017-03-17 DIAGNOSIS — J309 Allergic rhinitis, unspecified: Secondary | ICD-10-CM | POA: Diagnosis not present

## 2017-03-25 ENCOUNTER — Ambulatory Visit: Payer: Non-veteran care

## 2017-03-25 ENCOUNTER — Ambulatory Visit (INDEPENDENT_AMBULATORY_CARE_PROVIDER_SITE_OTHER): Payer: Non-veteran care

## 2017-03-25 DIAGNOSIS — J309 Allergic rhinitis, unspecified: Secondary | ICD-10-CM

## 2017-04-01 ENCOUNTER — Ambulatory Visit (INDEPENDENT_AMBULATORY_CARE_PROVIDER_SITE_OTHER): Payer: Non-veteran care

## 2017-04-01 DIAGNOSIS — J309 Allergic rhinitis, unspecified: Secondary | ICD-10-CM

## 2017-04-10 ENCOUNTER — Telehealth: Payer: Self-pay

## 2017-04-10 NOTE — Telephone Encounter (Signed)
Patients referral expires on 04-25-2017. New auth request has been faxed to [facility] today.

## 2017-04-13 ENCOUNTER — Ambulatory Visit (INDEPENDENT_AMBULATORY_CARE_PROVIDER_SITE_OTHER): Payer: Non-veteran care | Admitting: *Deleted

## 2017-04-13 DIAGNOSIS — J309 Allergic rhinitis, unspecified: Secondary | ICD-10-CM

## 2017-04-28 NOTE — Telephone Encounter (Signed)
Called patient to see if he has received a new authorization due to the [facility] not responding with a new authorization. Will Try contacting the [facility] again.

## 2017-04-29 NOTE — Telephone Encounter (Signed)
Auth refaxed to the [facility]

## 2017-05-04 NOTE — Telephone Encounter (Signed)
Received a denial letter from the [facility]. States the patient needs to get a [facility] Consult with the allergist at the [facility]. Once the patient is seen they can send a new authorization. Left a detailed voicemail for the patient.

## 2017-05-20 NOTE — Telephone Encounter (Signed)
Referral has been received for the patient to continue his injections. I left a voicemail for the patient.

## 2017-07-28 IMAGING — CR DG CHEST 2V
2 series · 2 of 2 positions shown · non-contrast
Comparison: None.

CLINICAL DATA: Cough and shortness of breath for 4 days.

EXAM:
CHEST  2 VIEW

[w chest pa]
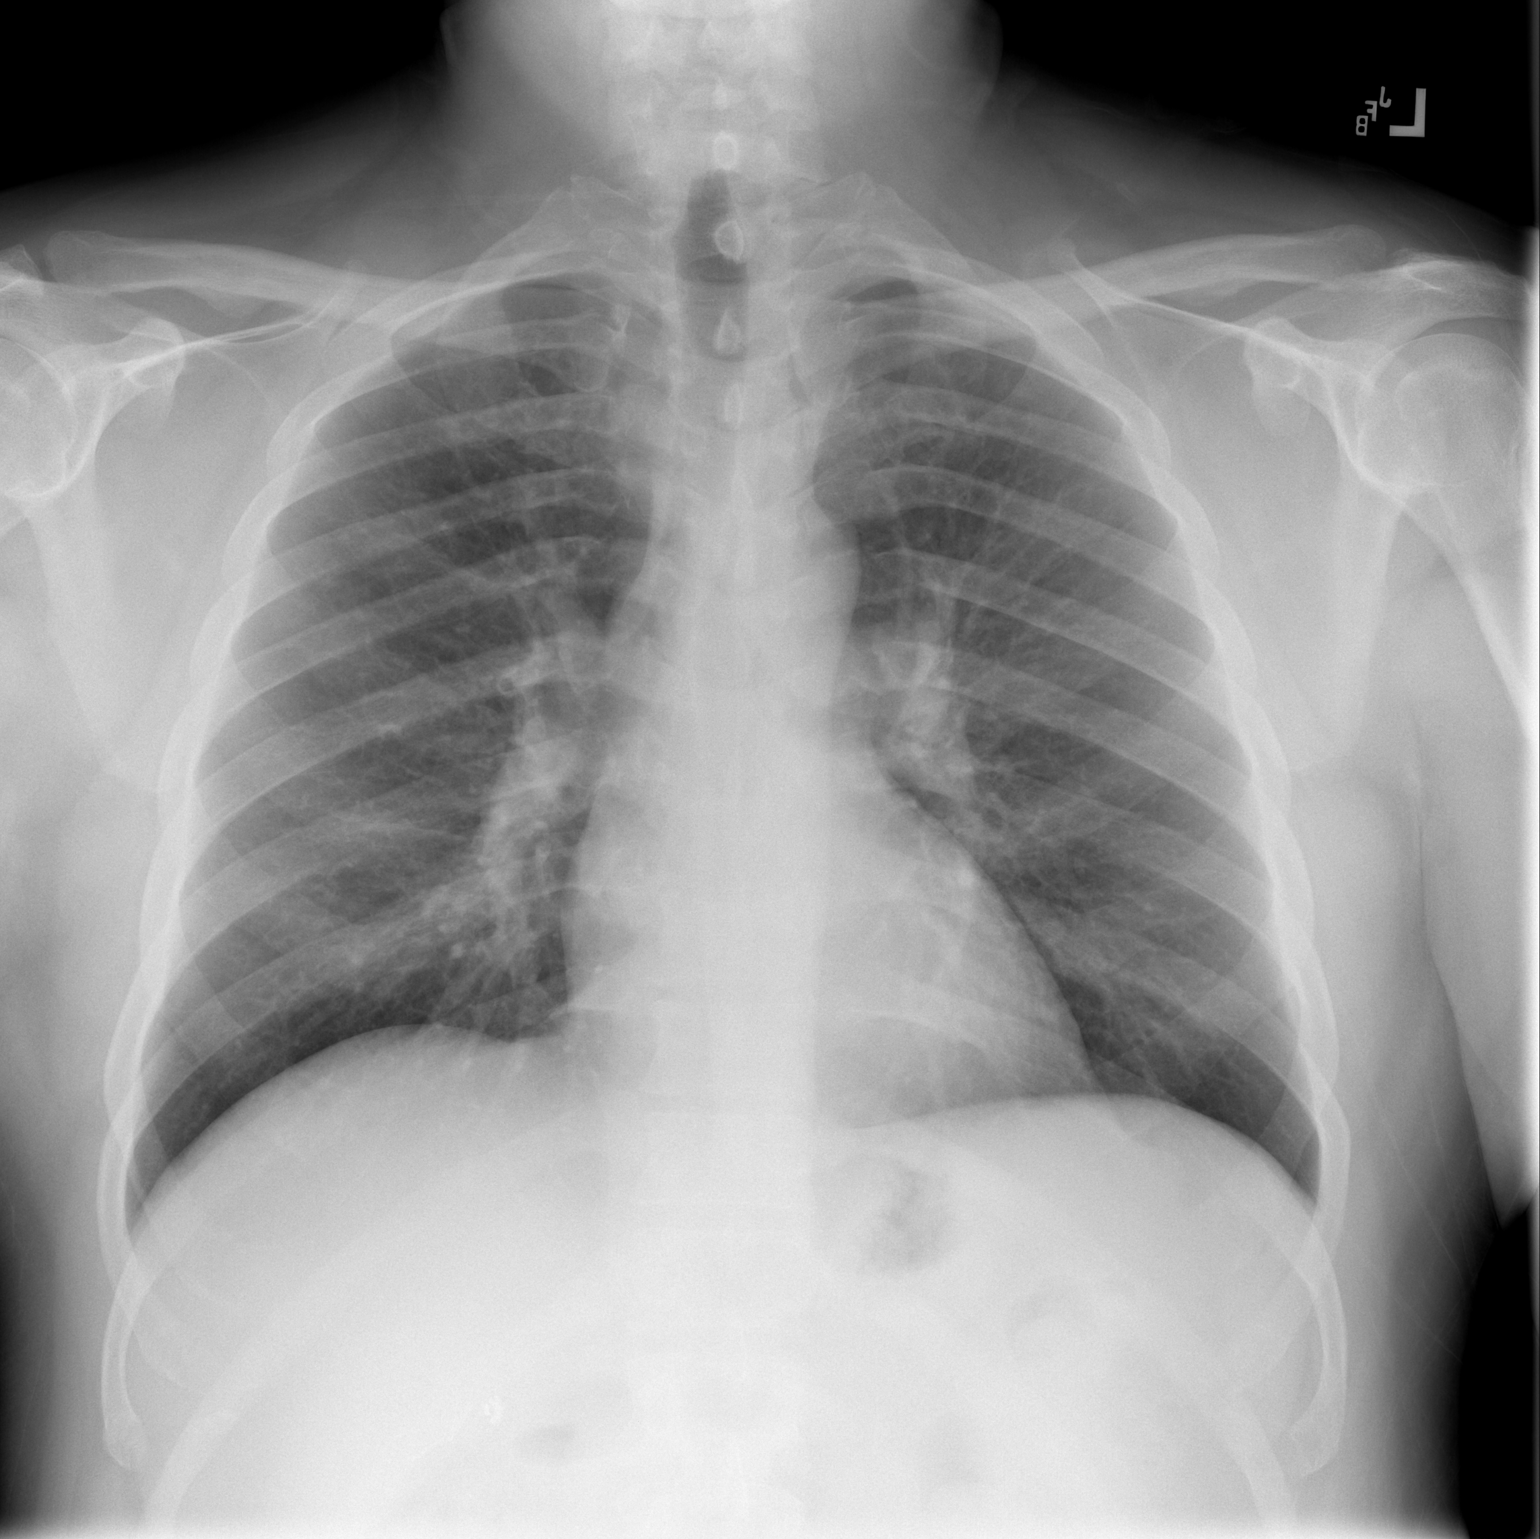

[w chest lat]
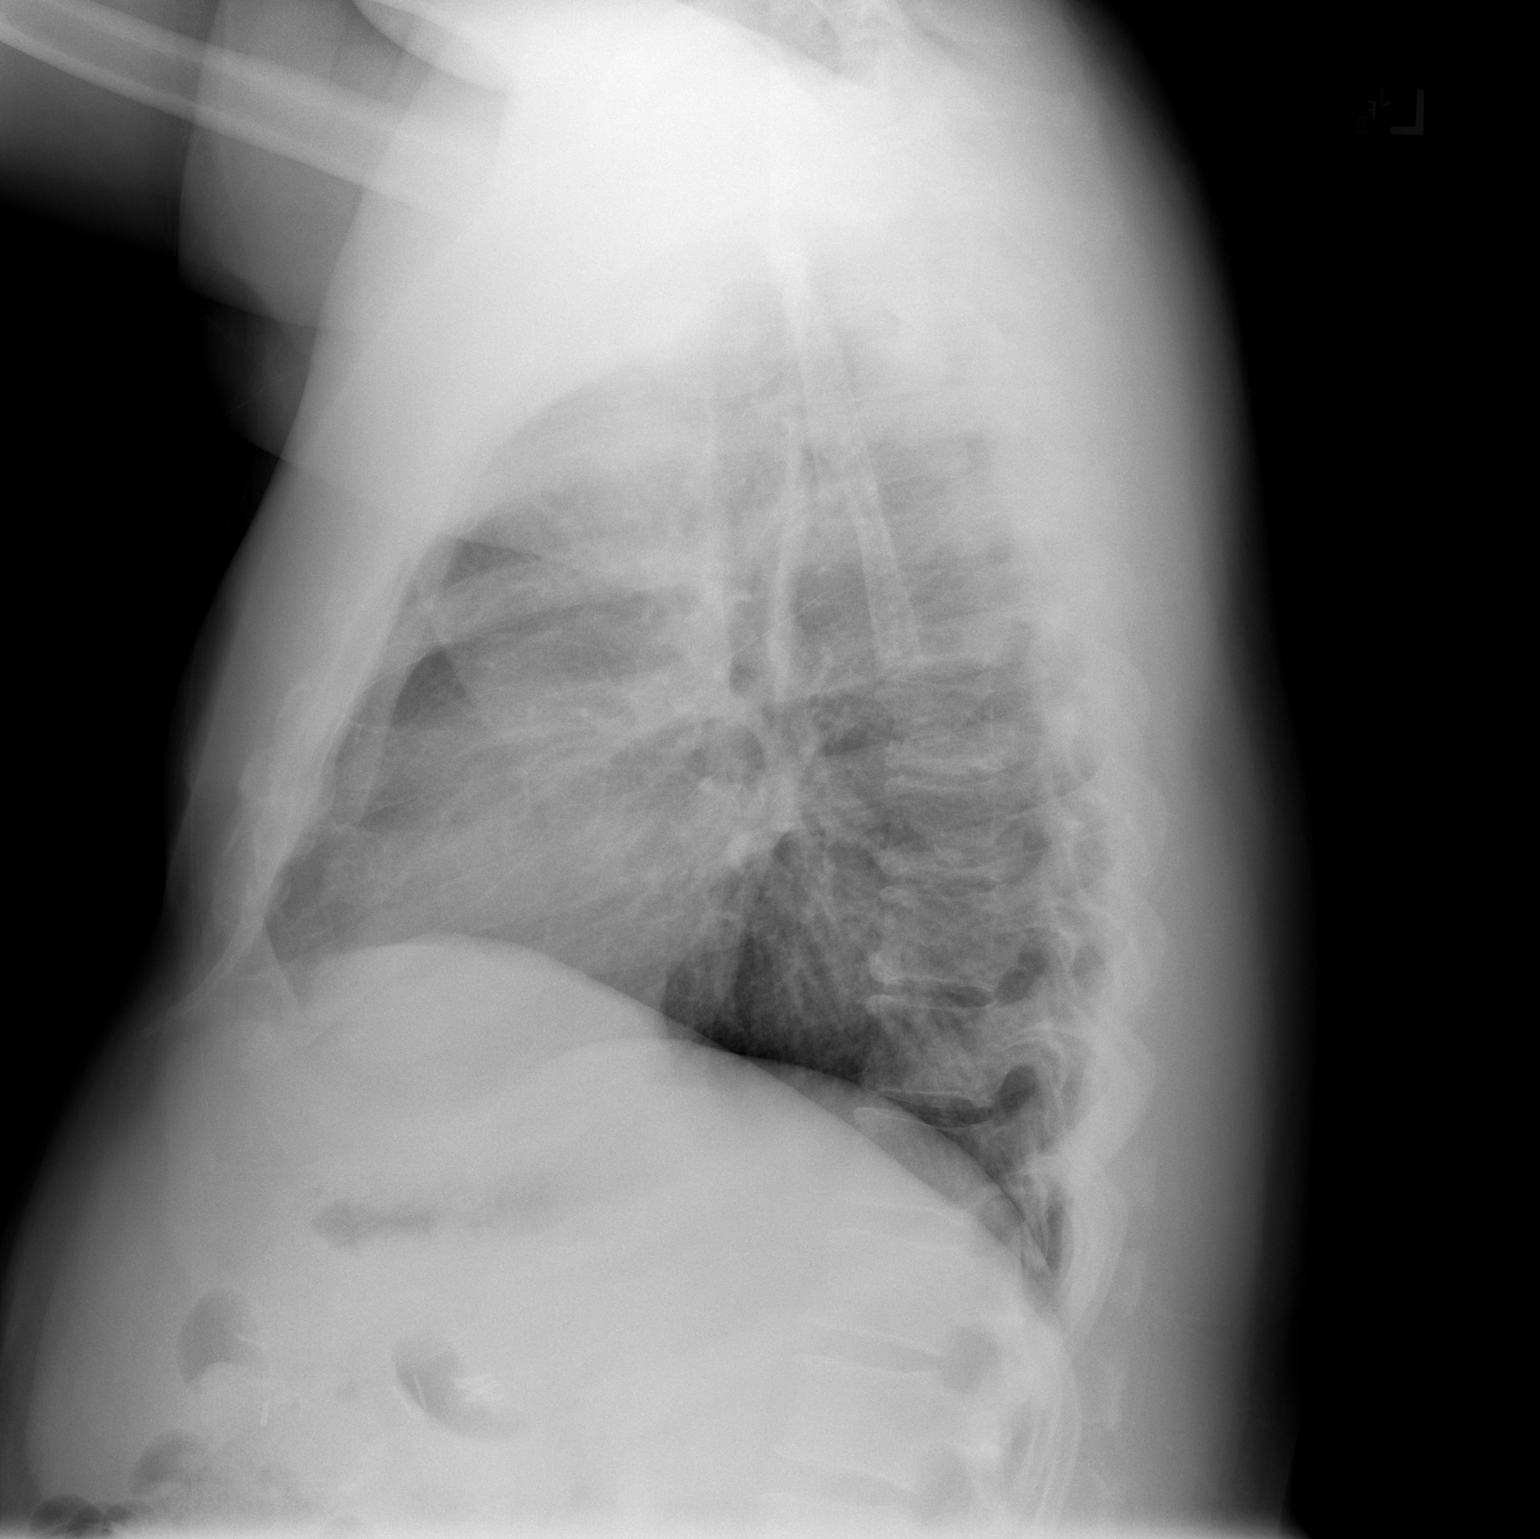

[2 of 2 positions shown; findings below may reference images not displayed]

FINDINGS: Heart size and pulmonary vascularity are normal. The lungs are clear
except for peribronchial thickening. No effusions. No bone
abnormality.
IMPRESSION: Bronchitic changes.

## 2017-08-04 ENCOUNTER — Telehealth: Payer: Self-pay | Admitting: *Deleted

## 2017-08-04 NOTE — Telephone Encounter (Signed)
Restart at RED 0.05 ml and build up with schedule B

## 2017-08-04 NOTE — Telephone Encounter (Signed)
Patient got his [facility] approval again and wants to restart injections. He was on Red #2 0.4 and last dose was given on 04/13/17. Please advise on where to restart.

## 2017-08-05 ENCOUNTER — Ambulatory Visit (INDEPENDENT_AMBULATORY_CARE_PROVIDER_SITE_OTHER): Payer: PRIVATE HEALTH INSURANCE

## 2017-08-05 DIAGNOSIS — J309 Allergic rhinitis, unspecified: Secondary | ICD-10-CM

## 2017-08-05 NOTE — Telephone Encounter (Signed)
Patient called back today advised where he would restart. Patient is coming to get injection today.

## 2017-08-14 ENCOUNTER — Ambulatory Visit (INDEPENDENT_AMBULATORY_CARE_PROVIDER_SITE_OTHER): Payer: PRIVATE HEALTH INSURANCE

## 2017-08-14 DIAGNOSIS — J309 Allergic rhinitis, unspecified: Secondary | ICD-10-CM | POA: Diagnosis not present

## 2017-08-26 ENCOUNTER — Ambulatory Visit (INDEPENDENT_AMBULATORY_CARE_PROVIDER_SITE_OTHER): Payer: PRIVATE HEALTH INSURANCE | Admitting: *Deleted

## 2017-08-26 DIAGNOSIS — J309 Allergic rhinitis, unspecified: Secondary | ICD-10-CM

## 2017-08-31 ENCOUNTER — Ambulatory Visit (INDEPENDENT_AMBULATORY_CARE_PROVIDER_SITE_OTHER): Payer: PRIVATE HEALTH INSURANCE | Admitting: *Deleted

## 2017-08-31 DIAGNOSIS — J309 Allergic rhinitis, unspecified: Secondary | ICD-10-CM | POA: Diagnosis not present

## 2017-09-14 ENCOUNTER — Ambulatory Visit (INDEPENDENT_AMBULATORY_CARE_PROVIDER_SITE_OTHER): Payer: PRIVATE HEALTH INSURANCE

## 2017-09-14 DIAGNOSIS — J309 Allergic rhinitis, unspecified: Secondary | ICD-10-CM | POA: Diagnosis not present

## 2017-09-18 ENCOUNTER — Encounter: Payer: Self-pay | Admitting: *Deleted

## 2017-09-18 DIAGNOSIS — J3089 Other allergic rhinitis: Secondary | ICD-10-CM | POA: Diagnosis not present

## 2017-09-18 NOTE — Progress Notes (Signed)
Maintenance vial made. Exp: 09-19-18. hv 

## 2017-09-21 DIAGNOSIS — J301 Allergic rhinitis due to pollen: Secondary | ICD-10-CM | POA: Diagnosis not present

## 2017-09-25 ENCOUNTER — Ambulatory Visit (INDEPENDENT_AMBULATORY_CARE_PROVIDER_SITE_OTHER): Payer: PRIVATE HEALTH INSURANCE

## 2017-09-25 DIAGNOSIS — J309 Allergic rhinitis, unspecified: Secondary | ICD-10-CM | POA: Diagnosis not present

## 2017-10-02 ENCOUNTER — Ambulatory Visit (INDEPENDENT_AMBULATORY_CARE_PROVIDER_SITE_OTHER): Payer: PRIVATE HEALTH INSURANCE

## 2017-10-02 DIAGNOSIS — J309 Allergic rhinitis, unspecified: Secondary | ICD-10-CM

## 2017-10-16 ENCOUNTER — Ambulatory Visit (INDEPENDENT_AMBULATORY_CARE_PROVIDER_SITE_OTHER): Payer: PRIVATE HEALTH INSURANCE

## 2017-10-16 DIAGNOSIS — J309 Allergic rhinitis, unspecified: Secondary | ICD-10-CM

## 2017-10-27 ENCOUNTER — Ambulatory Visit (INDEPENDENT_AMBULATORY_CARE_PROVIDER_SITE_OTHER): Payer: PRIVATE HEALTH INSURANCE | Admitting: *Deleted

## 2017-10-27 DIAGNOSIS — J309 Allergic rhinitis, unspecified: Secondary | ICD-10-CM

## 2017-11-03 ENCOUNTER — Ambulatory Visit (INDEPENDENT_AMBULATORY_CARE_PROVIDER_SITE_OTHER): Payer: PRIVATE HEALTH INSURANCE | Admitting: *Deleted

## 2017-11-03 DIAGNOSIS — J309 Allergic rhinitis, unspecified: Secondary | ICD-10-CM | POA: Diagnosis not present

## 2017-11-16 ENCOUNTER — Ambulatory Visit (INDEPENDENT_AMBULATORY_CARE_PROVIDER_SITE_OTHER): Payer: PRIVATE HEALTH INSURANCE | Admitting: *Deleted

## 2017-11-16 DIAGNOSIS — J309 Allergic rhinitis, unspecified: Secondary | ICD-10-CM

## 2017-11-24 ENCOUNTER — Ambulatory Visit (INDEPENDENT_AMBULATORY_CARE_PROVIDER_SITE_OTHER): Payer: PRIVATE HEALTH INSURANCE | Admitting: *Deleted

## 2017-11-24 DIAGNOSIS — J309 Allergic rhinitis, unspecified: Secondary | ICD-10-CM

## 2017-12-08 ENCOUNTER — Ambulatory Visit (INDEPENDENT_AMBULATORY_CARE_PROVIDER_SITE_OTHER): Payer: PRIVATE HEALTH INSURANCE | Admitting: *Deleted

## 2017-12-08 DIAGNOSIS — J309 Allergic rhinitis, unspecified: Secondary | ICD-10-CM | POA: Diagnosis not present

## 2017-12-15 ENCOUNTER — Ambulatory Visit (INDEPENDENT_AMBULATORY_CARE_PROVIDER_SITE_OTHER): Payer: PRIVATE HEALTH INSURANCE | Admitting: *Deleted

## 2017-12-15 DIAGNOSIS — J309 Allergic rhinitis, unspecified: Secondary | ICD-10-CM | POA: Diagnosis not present

## 2018-01-05 ENCOUNTER — Encounter: Payer: Self-pay | Admitting: Allergy and Immunology

## 2018-01-05 ENCOUNTER — Ambulatory Visit: Payer: PRIVATE HEALTH INSURANCE | Admitting: Allergy and Immunology

## 2018-01-05 ENCOUNTER — Ambulatory Visit (INDEPENDENT_AMBULATORY_CARE_PROVIDER_SITE_OTHER): Payer: PRIVATE HEALTH INSURANCE | Admitting: Allergy and Immunology

## 2018-01-05 ENCOUNTER — Ambulatory Visit: Payer: Self-pay | Admitting: *Deleted

## 2018-01-05 VITALS — BP 110/80 | HR 72 | Resp 20 | Ht 67.5 in | Wt 239.0 lb

## 2018-01-05 DIAGNOSIS — J4540 Moderate persistent asthma, uncomplicated: Secondary | ICD-10-CM | POA: Diagnosis not present

## 2018-01-05 DIAGNOSIS — J309 Allergic rhinitis, unspecified: Secondary | ICD-10-CM | POA: Diagnosis not present

## 2018-01-05 DIAGNOSIS — J3089 Other allergic rhinitis: Secondary | ICD-10-CM | POA: Diagnosis not present

## 2018-01-05 DIAGNOSIS — Z91018 Allergy to other foods: Secondary | ICD-10-CM

## 2018-01-05 DIAGNOSIS — K219 Gastro-esophageal reflux disease without esophagitis: Secondary | ICD-10-CM | POA: Diagnosis not present

## 2018-01-05 NOTE — Patient Instructions (Addendum)
  1. Continue to perform Allergen avoidance measures  2. Continue to Treat and prevent inflammation:   A. Flonase one-2 sprays each nostril one time per day  B. montelukast 10 mg one tablet one time per day  C.  Breo 200 -1 inhalation 1 time per day (replaces Symbicort)  D.  Spiriva 1.25 respimat 2 inhalations one time per day  3. Continue therapy for reflux with omeprazole 20 mg twice a day  4. If needed:   A. Proventil/Pro-air/Ventolin 2 puffs every 4-6 hours if needed  B. Antihistamine - Claritin/Zyrtec/Allegra  C. EpiPen, Benadryl, M.D./ER for allergic reaction  5. Continue immunotherapy and Epi-Pen. Take Cetirizine 10mg  tablet on morning of immunotherapy.  6. Return to clinic in 6 months or earlier if problem

## 2018-01-05 NOTE — Progress Notes (Signed)
Follow-up Note  Referring Provider: Pecolia Ades, MD Primary Provider: System, Pcp Not In Date of Office Visit: 01/05/2018  Subjective:   Harry Simmons (DOB: 24-Jan-1961) is a 57 y.o. male who [facility] in re-evaluation of the following:  HPI: Elih presents to this clinic in reevaluation of asthma and allergic rhinitis and history of food allergy directed against shellfish as well as reflux.  I have not seen him in this clinic since 10 June 2016.  He is done very well with his airway.  He has very little issues with asthma and has not required a systemic steroid to treat an exacerbation.  However, he must use a short acting bronchodilator if he cuts the grass or if he runs around and plays with his children.  This averages out to about 1 or 2 times per week.  This occurs with the use of Symbicort but it should be noted that his Symbicort use is only one time per day.  He also continues on Spiriva and montelukast.  Overall he thinks his nose is doing quite well.  He does continue to use a nasal steroid on a regular basis.  Immunotherapy is going quite well at every 2 weeks without any adverse effects.  Reflux has been under very good control while using his omeprazole.  He remains away from consumption of shellfish.  He did obtain a flu vaccine this year.  Allergies as of 01/05/2018      Reactions   Codeine Hives, Rash   Tolerates Dilaudid.   Lobster [shellfish Allergy] Shortness Of Breath   Patient stated not all shellfish just lobster      Medication List      acetaminophen 500 MG tablet Commonly known as:  TYLENOL Take 500 mg by mouth 3 (three) times daily as needed.   albuterol (2.5 MG/3ML) 0.083% nebulizer solution Commonly known as:  PROVENTIL Take 3 mLs (2.5 mg total) by nebulization every 6 (six) hours as needed for wheezing or shortness of breath.   albuterol 108 (90 Base) MCG/ACT inhaler Commonly known  as:  PROVENTIL HFA;VENTOLIN HFA Inhale 2 puffs into the lungs every 6 (six) hours as needed.   budesonide-formoterol 160-4.5 MCG/ACT inhaler Commonly known as:  SYMBICORT Inhale 2 puffs into the lungs 2 (two) times daily.   buPROPion 150 MG 12 hr tablet Commonly known as:  WELLBUTRIN SR Take 150 mg by mouth daily.   clobetasol ointment 0.05 % Commonly known as:  TEMOVATE Apply 1 application topically 2 (two) times daily.   dibucaine 1 % ointment Commonly known as:  NUPERCAINAL Apply 1 application topically 3 (three) times daily as needed for pain.   docusate sodium 100 MG capsule Commonly known as:  COLACE Take 200 mg by mouth at bedtime as needed for mild constipation.   EPINEPHrine 0.3 mg/0.3 mL Soaj injection Commonly known as:  EPI-PEN Use as directed for severe allergic reaction   fluticasone 50 MCG/ACT nasal spray Commonly known as:  FLONASE Place 2 sprays into both nostrils daily.   hydrocortisone 2.5 % cream Apply 1 application topically 2 (two) times daily.   ibuprofen 400 MG tablet Commonly known as:  ADVIL,MOTRIN Take 1 tablet (400 mg total) by mouth every 8 (eight) hours as needed.   lidocaine 4 % cream Commonly known as:  LMX Apply 1 application topically 3 (three) times daily as needed.   loratadine 10 MG tablet Commonly known as:  CLARITIN Take 10  mg by mouth daily as needed. For allergies   methocarbamol 750 MG tablet Commonly known as:  ROBAXIN Take 750 mg by mouth at bedtime as needed for muscle spasms.   montelukast 10 MG tablet Commonly known as:  SINGULAIR Take 1 tablet (10 mg total) by mouth at bedtime.   omeprazole 20 MG capsule Commonly known as:  PRILOSEC Take one capsule twice daily   sertraline 100 MG tablet Commonly known as:  ZOLOFT Take 100 mg by mouth daily.   sildenafil 100 MG tablet Commonly known as:  VIAGRA Take 100 mg by mouth daily as needed for erectile dysfunction.   Tiotropium Bromide Monohydrate 1.25 MCG/ACT  Aers Inhale 2 puffs into the lungs daily.   traMADol 50 MG tablet Commonly known as:  ULTRAM Take 50 mg by mouth 2 (two) times daily.   traZODone 50 MG tablet Commonly known as:  DESYREL Take 50 mg by mouth at bedtime.   zolpidem 10 MG tablet Commonly known as:  AMBIEN Take 10 mg by mouth at bedtime as needed for sleep.       Past Medical History:  Diagnosis Date  . Allergic rhinitis   . Asthma   . GERD (gastroesophageal reflux disease)   . PTSD (post-traumatic stress disorder)     Past Surgical History:  Procedure Laterality Date  . CHOLECYSTECTOMY N/A 05/20/2012   Procedure: LAPAROSCOPIC CHOLECYSTECTOMY WITH INTRAOPERATIVE CHOLANGIOGRAM;  Surgeon: Robyne Askew, MD;  Location: [facility];  Service: General;  Laterality: N/A;  . COLONOSCOPY    . ERCP N/A 05/18/2012   Procedure: ENDOSCOPIC RETROGRADE CHOLANGIOPANCREATOGRAPHY (ERCP);  Surgeon: Theda Belfast, MD;  Location: [facility];  Service: Endoscopy;  Laterality: N/A;    Review of systems negative except as noted in HPI / PMHx or noted below:  Review of Systems  Constitutional: Negative.   HENT: Negative.   Eyes: Negative.   Respiratory: Negative.   Cardiovascular: Negative.   Gastrointestinal: Negative.   Genitourinary: Negative.   Musculoskeletal: Negative.   Skin: Negative.   Neurological: Negative.   Endo/Heme/Allergies: Negative.   Psychiatric/Behavioral: Negative.      Objective:   Vitals:   01/05/18 1709  BP: 110/80  Pulse: 72  Resp: 20   Height: 5' 7.5" (171.5 cm)  Weight: 239 lb (108.4 kg)   Physical Exam  HENT:  Head: Normocephalic.  Right Ear: Tympanic membrane, external ear and ear canal normal.  Left Ear: Tympanic membrane, external ear and ear canal normal.  Nose: Nose normal. No mucosal edema or rhinorrhea.  Mouth/Throat: Uvula is midline, oropharynx is clear and moist and mucous membranes are normal. No oropharyngeal exudate.  Eyes: Conjunctivae are normal.  Neck: Trachea  normal. No tracheal tenderness present. No tracheal deviation present. No thyromegaly present.  Cardiovascular: Normal rate, regular rhythm, S1 normal, S2 normal and normal heart sounds.  No murmur heard. Pulmonary/Chest: Breath sounds normal. No stridor. No respiratory distress. He has no wheezes. He has no rales.  Musculoskeletal: He exhibits no edema.  Lymphadenopathy:       Head (right side): No tonsillar adenopathy present.       Head (left side): No tonsillar adenopathy present.    He has no cervical adenopathy.  Neurological: He is alert.  Skin: No rash noted. He is not diaphoretic. No erythema. Nails show no clubbing.    Diagnostics:    Spirometry was performed and demonstrated an FEV1 of 2.22 at 74 % of predicted.  The patient had an Asthma Control Test with  the following results: ACT Total Score: 17.    Assessment and Plan:   1. Not well controlled moderate persistent asthma   2. Other allergic rhinitis   3. Gastroesophageal reflux disease, esophagitis presence not specified   4. Food allergy      1. Continue to perform Allergen avoidance measures  2. Continue to Treat and prevent inflammation:   A. Flonase 1-2 sprays each nostril one time per day  B. montelukast 10 mg one tablet one time per day  C.  Breo 200 -1 inhalation 1 time per day (replaces Symbicort)  D.  Spiriva 1.25 respimat 2 inhalations one time per day  3. Continue therapy for reflux with omeprazole 20 mg twice a day  4. If needed:   A. Proventil/Pro-air/Ventolin 2 puffs every 4-6 hours if needed  B. Antihistamine - Claritin/Zyrtec/Allegra  C. EpiPen, Benadryl, M.D./ER for allergic reaction  5. Continue immunotherapy and Epi-Pen. Take Cetirizine 10mg  tablet on morning of immunotherapy.  6. Return to clinic in 6 months or earlier if problem  I have given Lisabeth Devoid to replace his Symbicort as he is only using his Symbicort 1 time per day and he is not receiving full benefit from the use of this  medication and hopefully with Virgel Bouquet one time per day his control of asthma will be better.  He will continue on other anti-inflammatory medications for his respiratory tract as noted above.  He will continue on immunotherapy and also continue to treat his reflux.  I will see him back in this clinic in 6 months or earlier if there is a problem.  Laurette Schimke, MD Allergy / Immunology [facility]

## 2018-02-01 ENCOUNTER — Ambulatory Visit (INDEPENDENT_AMBULATORY_CARE_PROVIDER_SITE_OTHER): Payer: PRIVATE HEALTH INSURANCE | Admitting: *Deleted

## 2018-02-01 DIAGNOSIS — J309 Allergic rhinitis, unspecified: Secondary | ICD-10-CM | POA: Diagnosis not present

## 2018-02-16 NOTE — Progress Notes (Signed)
VIALS EXP 02-17-19 

## 2018-02-17 ENCOUNTER — Ambulatory Visit (INDEPENDENT_AMBULATORY_CARE_PROVIDER_SITE_OTHER): Payer: PRIVATE HEALTH INSURANCE | Admitting: *Deleted

## 2018-02-17 DIAGNOSIS — J309 Allergic rhinitis, unspecified: Secondary | ICD-10-CM

## 2018-02-18 DIAGNOSIS — J301 Allergic rhinitis due to pollen: Secondary | ICD-10-CM

## 2018-02-19 DIAGNOSIS — J3089 Other allergic rhinitis: Secondary | ICD-10-CM | POA: Diagnosis not present

## 2018-03-12 ENCOUNTER — Ambulatory Visit (INDEPENDENT_AMBULATORY_CARE_PROVIDER_SITE_OTHER): Payer: PRIVATE HEALTH INSURANCE | Admitting: *Deleted

## 2018-03-12 DIAGNOSIS — J309 Allergic rhinitis, unspecified: Secondary | ICD-10-CM

## 2018-03-30 ENCOUNTER — Ambulatory Visit (INDEPENDENT_AMBULATORY_CARE_PROVIDER_SITE_OTHER): Payer: PRIVATE HEALTH INSURANCE | Admitting: *Deleted

## 2018-03-30 DIAGNOSIS — J309 Allergic rhinitis, unspecified: Secondary | ICD-10-CM | POA: Diagnosis not present

## 2018-04-06 ENCOUNTER — Ambulatory Visit (INDEPENDENT_AMBULATORY_CARE_PROVIDER_SITE_OTHER): Payer: PRIVATE HEALTH INSURANCE | Admitting: *Deleted

## 2018-04-06 DIAGNOSIS — J309 Allergic rhinitis, unspecified: Secondary | ICD-10-CM

## 2018-04-12 ENCOUNTER — Ambulatory Visit (INDEPENDENT_AMBULATORY_CARE_PROVIDER_SITE_OTHER): Payer: PRIVATE HEALTH INSURANCE | Admitting: *Deleted

## 2018-04-12 DIAGNOSIS — J309 Allergic rhinitis, unspecified: Secondary | ICD-10-CM | POA: Diagnosis not present

## 2018-04-19 ENCOUNTER — Ambulatory Visit (INDEPENDENT_AMBULATORY_CARE_PROVIDER_SITE_OTHER): Payer: PRIVATE HEALTH INSURANCE | Admitting: *Deleted

## 2018-04-19 DIAGNOSIS — J309 Allergic rhinitis, unspecified: Secondary | ICD-10-CM

## 2018-05-03 ENCOUNTER — Ambulatory Visit (INDEPENDENT_AMBULATORY_CARE_PROVIDER_SITE_OTHER): Payer: PRIVATE HEALTH INSURANCE | Admitting: *Deleted

## 2018-05-03 DIAGNOSIS — J309 Allergic rhinitis, unspecified: Secondary | ICD-10-CM | POA: Diagnosis not present

## 2018-05-10 ENCOUNTER — Ambulatory Visit (INDEPENDENT_AMBULATORY_CARE_PROVIDER_SITE_OTHER): Payer: PRIVATE HEALTH INSURANCE

## 2018-05-10 DIAGNOSIS — J309 Allergic rhinitis, unspecified: Secondary | ICD-10-CM

## 2018-05-25 ENCOUNTER — Other Ambulatory Visit: Payer: Self-pay

## 2018-05-25 ENCOUNTER — Ambulatory Visit (INDEPENDENT_AMBULATORY_CARE_PROVIDER_SITE_OTHER): Payer: PRIVATE HEALTH INSURANCE | Admitting: *Deleted

## 2018-05-25 DIAGNOSIS — J309 Allergic rhinitis, unspecified: Secondary | ICD-10-CM | POA: Diagnosis not present

## 2018-06-08 ENCOUNTER — Ambulatory Visit (INDEPENDENT_AMBULATORY_CARE_PROVIDER_SITE_OTHER): Payer: PRIVATE HEALTH INSURANCE | Admitting: *Deleted

## 2018-06-08 DIAGNOSIS — J309 Allergic rhinitis, unspecified: Secondary | ICD-10-CM | POA: Diagnosis not present

## 2018-06-21 ENCOUNTER — Ambulatory Visit (INDEPENDENT_AMBULATORY_CARE_PROVIDER_SITE_OTHER): Payer: PRIVATE HEALTH INSURANCE

## 2018-06-21 DIAGNOSIS — J309 Allergic rhinitis, unspecified: Secondary | ICD-10-CM

## 2018-07-06 NOTE — Progress Notes (Signed)
Vials exp 07-06-2019 

## 2018-07-07 ENCOUNTER — Ambulatory Visit (INDEPENDENT_AMBULATORY_CARE_PROVIDER_SITE_OTHER): Payer: PRIVATE HEALTH INSURANCE

## 2018-07-07 DIAGNOSIS — J309 Allergic rhinitis, unspecified: Secondary | ICD-10-CM

## 2018-07-08 DIAGNOSIS — J3089 Other allergic rhinitis: Secondary | ICD-10-CM | POA: Diagnosis not present

## 2018-07-09 DIAGNOSIS — J301 Allergic rhinitis due to pollen: Secondary | ICD-10-CM | POA: Diagnosis not present

## 2018-07-23 ENCOUNTER — Ambulatory Visit (INDEPENDENT_AMBULATORY_CARE_PROVIDER_SITE_OTHER): Payer: PRIVATE HEALTH INSURANCE | Admitting: *Deleted

## 2018-07-23 DIAGNOSIS — J309 Allergic rhinitis, unspecified: Secondary | ICD-10-CM

## 2018-08-04 ENCOUNTER — Telehealth: Payer: Self-pay | Admitting: Allergy and Immunology

## 2018-08-04 NOTE — Telephone Encounter (Signed)
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 He is requesting a letter be written for him to be out of work for 2 weeks, due to his underlying conditions.

## 2018-08-04 NOTE — Telephone Encounter (Signed)
Letter completed. Patient informed and will pick up letter.

## 2018-08-04 NOTE — Telephone Encounter (Signed)
Dr Kozlow please advise 

## 2018-08-04 NOTE — Telephone Encounter (Signed)
Please provide him a letter. I believe we have several floating around the clinic.

## 2018-08-11 ENCOUNTER — Ambulatory Visit (INDEPENDENT_AMBULATORY_CARE_PROVIDER_SITE_OTHER): Payer: PRIVATE HEALTH INSURANCE

## 2018-08-11 DIAGNOSIS — J309 Allergic rhinitis, unspecified: Secondary | ICD-10-CM | POA: Diagnosis not present

## 2018-08-30 ENCOUNTER — Ambulatory Visit (INDEPENDENT_AMBULATORY_CARE_PROVIDER_SITE_OTHER): Payer: PRIVATE HEALTH INSURANCE

## 2018-08-30 DIAGNOSIS — J309 Allergic rhinitis, unspecified: Secondary | ICD-10-CM | POA: Diagnosis not present

## 2018-09-14 ENCOUNTER — Ambulatory Visit (INDEPENDENT_AMBULATORY_CARE_PROVIDER_SITE_OTHER): Payer: PRIVATE HEALTH INSURANCE | Admitting: *Deleted

## 2018-09-14 DIAGNOSIS — J309 Allergic rhinitis, unspecified: Secondary | ICD-10-CM | POA: Diagnosis not present

## 2018-09-22 ENCOUNTER — Ambulatory Visit (INDEPENDENT_AMBULATORY_CARE_PROVIDER_SITE_OTHER): Payer: PRIVATE HEALTH INSURANCE | Admitting: *Deleted

## 2018-09-22 DIAGNOSIS — J309 Allergic rhinitis, unspecified: Secondary | ICD-10-CM | POA: Diagnosis not present

## 2018-09-28 ENCOUNTER — Ambulatory Visit (INDEPENDENT_AMBULATORY_CARE_PROVIDER_SITE_OTHER): Payer: PRIVATE HEALTH INSURANCE | Admitting: *Deleted

## 2018-09-28 DIAGNOSIS — J309 Allergic rhinitis, unspecified: Secondary | ICD-10-CM

## 2018-10-08 ENCOUNTER — Ambulatory Visit (INDEPENDENT_AMBULATORY_CARE_PROVIDER_SITE_OTHER): Payer: PRIVATE HEALTH INSURANCE

## 2018-10-08 DIAGNOSIS — J309 Allergic rhinitis, unspecified: Secondary | ICD-10-CM

## 2018-10-21 ENCOUNTER — Ambulatory Visit (INDEPENDENT_AMBULATORY_CARE_PROVIDER_SITE_OTHER): Payer: PRIVATE HEALTH INSURANCE

## 2018-10-21 DIAGNOSIS — J309 Allergic rhinitis, unspecified: Secondary | ICD-10-CM

## 2018-11-05 ENCOUNTER — Telehealth: Payer: Self-pay

## 2018-11-05 NOTE — Telephone Encounter (Signed)
Patients [place] auth exp on 11-17-2018. I have faxed a request to the [place]. I will call the patient to schedule his next OV.

## 2018-11-11 ENCOUNTER — Ambulatory Visit (INDEPENDENT_AMBULATORY_CARE_PROVIDER_SITE_OTHER): Payer: PRIVATE HEALTH INSURANCE | Admitting: *Deleted

## 2018-11-11 DIAGNOSIS — J309 Allergic rhinitis, unspecified: Secondary | ICD-10-CM

## 2018-11-17 DIAGNOSIS — J301 Allergic rhinitis due to pollen: Secondary | ICD-10-CM | POA: Diagnosis not present

## 2018-11-17 NOTE — Progress Notes (Signed)
VIALS EXP 11-17-19 

## 2018-11-23 DIAGNOSIS — J3089 Other allergic rhinitis: Secondary | ICD-10-CM

## 2018-11-30 ENCOUNTER — Ambulatory Visit: Payer: Self-pay | Admitting: *Deleted

## 2018-11-30 ENCOUNTER — Other Ambulatory Visit: Payer: Self-pay

## 2018-11-30 ENCOUNTER — Encounter: Payer: Self-pay | Admitting: Allergy and Immunology

## 2018-11-30 ENCOUNTER — Ambulatory Visit (INDEPENDENT_AMBULATORY_CARE_PROVIDER_SITE_OTHER): Payer: PRIVATE HEALTH INSURANCE | Admitting: Allergy and Immunology

## 2018-11-30 VITALS — BP 124/84 | HR 55 | Resp 16 | Ht 67.5 in | Wt 245.6 lb

## 2018-11-30 DIAGNOSIS — Z91018 Allergy to other foods: Secondary | ICD-10-CM | POA: Diagnosis not present

## 2018-11-30 DIAGNOSIS — J4540 Moderate persistent asthma, uncomplicated: Secondary | ICD-10-CM

## 2018-11-30 DIAGNOSIS — J309 Allergic rhinitis, unspecified: Secondary | ICD-10-CM

## 2018-11-30 DIAGNOSIS — K219 Gastro-esophageal reflux disease without esophagitis: Secondary | ICD-10-CM | POA: Diagnosis not present

## 2018-11-30 DIAGNOSIS — J3089 Other allergic rhinitis: Secondary | ICD-10-CM

## 2018-11-30 NOTE — Progress Notes (Signed)
[place] [place] [facility] [place] [facility] [place] [facility] [place]   Follow-up Note  Referring Provider: Pecolia Ades, MD Primary Provider: System, Pcp Not In Date of Office Visit: 11/30/2018  Subjective:   Harry Simmons (DOB: 1961/01/29) is a 58 y.o. male who [facility]2018 in re-evaluation of the following:  HPI: Harry Simmons returns to this clinic in evaluation of asthma and allergic rhinitis and history of food allergy directed against shellfish and reflux.  His last visit to this clinic was 05 January 2018.  He has really done very well with asthma over the course of the past year.  He has only had one exacerbation of his asthma requiring prednisone in January or early February 2020 that appeared to be precipitated by a upper respiratory tract infection associated with a fever.  Otherwise, he has not required any additional systemic steroids or antibiotics for any type of airway issue while he remains on Symbicort utilize mostly 1 time per day in the evening and continues on montelukast and Spiriva.  For the most part his nose has been doing very well while he continues on a nasal steroid.  His immunotherapy is going quite well currently at every 3 weeks without any adverse effect.  He was able to go through the entire spring with no problem at all regarding his respiratory tract.  Reflux is under very good control while he continues on a proton pump inhibitor.  He remains away from eating all shellfish.  He had repair of his left rotator cuff late May 2020 and did quite well with the administered anesthetic without any significant respiratory tract issues.  Allergies as of 11/30/2018      Reactions   Codeine Hives, Rash   Tolerates Dilaudid.   Lobster [shellfish Allergy] Shortness Of Breath   Patient stated not all shellfish just lobster      Medication List      acetaminophen 500 MG tablet Commonly known as: TYLENOL Take 500 mg by  mouth 3 (three) times daily as needed.   albuterol (2.5 MG/3ML) 0.083% nebulizer solution Commonly known as: PROVENTIL Take 3 mLs (2.5 mg total) by nebulization every 6 (six) hours as needed for wheezing or shortness of breath.   albuterol 108 (90 Base) MCG/ACT inhaler Commonly known as: VENTOLIN HFA Inhale 2 puffs into the lungs every 6 (six) hours as needed.   budesonide-formoterol 160-4.5 MCG/ACT inhaler Commonly known as: SYMBICORT Inhale 2 puffs into the lungs 2 (two) times daily.   buPROPion 150 MG 12 hr tablet Commonly known as: WELLBUTRIN SR Take 150 mg by mouth daily.   clobetasol ointment 0.05 % Commonly known as: TEMOVATE Apply 1 application topically 2 (two) times daily.   dibucaine 1 % ointment Commonly known as: NUPERCAINAL Apply 1 application topically 3 (three) times daily as needed for pain.   docusate sodium 100 MG capsule Commonly known as: COLACE Take 200 mg by mouth at bedtime as needed for mild constipation.   EPINEPHrine 0.3 mg/0.3 mL Soaj injection Commonly known as: EPI-PEN Use as directed for severe allergic reaction   fluticasone 50 MCG/ACT nasal spray Commonly known as: FLONASE Place 2 sprays into both nostrils daily.   hydrocortisone 2.5 % cream Apply 1 application topically 2 (two) times daily.   ibuprofen 400 MG tablet Commonly known as: ADVIL Take 1 tablet (400 mg total) by mouth every 8 (eight) hours as needed.   lidocaine 4 % cream Commonly known as: LMX Apply  1 application topically 3 (three) times daily as needed.   loratadine 10 MG tablet Commonly known as: CLARITIN Take 10 mg by mouth daily as needed. For allergies   methocarbamol 750 MG tablet Commonly known as: ROBAXIN Take 750 mg by mouth at bedtime as needed for muscle spasms.   montelukast 10 MG tablet Commonly known as: SINGULAIR Take 1 tablet (10 mg total) by mouth at bedtime.   omeprazole 20 MG capsule Commonly known as: PRILOSEC Take one capsule twice daily    sertraline 100 MG tablet Commonly known as: ZOLOFT Take 100 mg by mouth daily.   sildenafil 100 MG tablet Commonly known as: VIAGRA Take 100 mg by mouth daily as needed for erectile dysfunction.   Tiotropium Bromide Monohydrate 1.25 MCG/ACT Aers Commonly known as: Spiriva Respimat Inhale 2 puffs into the lungs daily.   traMADol 50 MG tablet Commonly known as: ULTRAM Take 50 mg by mouth 2 (two) times daily.   traZODone 50 MG tablet Commonly known as: DESYREL Take 50 mg by mouth at bedtime.   zolpidem 10 MG tablet Commonly known as: AMBIEN Take 10 mg by mouth at bedtime as needed for sleep.       Past Medical History:  Diagnosis Date  . Allergic rhinitis   . Asthma   . GERD (gastroesophageal reflux disease)   . PTSD (post-traumatic stress disorder)     Past Surgical History:  Procedure Laterality Date  . CHOLECYSTECTOMY N/A 05/20/2012   Procedure: LAPAROSCOPIC CHOLECYSTECTOMY WITH INTRAOPERATIVE CHOLANGIOGRAM;  Surgeon: Robyne Askew, MD;  Location: [facility];  Service: General;  Laterality: N/A;  . COLONOSCOPY    . ERCP N/A 05/18/2012   Procedure: ENDOSCOPIC RETROGRADE CHOLANGIOPANCREATOGRAPHY (ERCP);  Surgeon: Theda Belfast, MD;  Location: [facility];  Service: Endoscopy;  Laterality: N/A;    Review of systems negative except as noted in HPI / PMHx or noted below:  Review of Systems  Constitutional: Negative.   HENT: Negative.   Eyes: Negative.   Respiratory: Negative.   Cardiovascular: Negative.   Gastrointestinal: Negative.   Genitourinary: Negative.   Musculoskeletal: Negative.   Skin: Negative.   Neurological: Negative.   Endo/Heme/Allergies: Negative.   Psychiatric/Behavioral: Negative.      Objective:   Vitals:   11/30/18 1014  BP: 124/84  Pulse: (!) 55  Resp: 16  SpO2: 94%   Height: 5' 7.5" (171.5 cm)  Weight: 245 lb 9.6 oz (111.4 kg)   Physical Exam Constitutional:      Appearance: He is not diaphoretic.  HENT:     Head:  Normocephalic.     Right Ear: Tympanic membrane, ear canal and external ear normal.     Left Ear: Tympanic membrane, ear canal and external ear normal.     Nose: Nose normal. No mucosal edema or rhinorrhea.     Mouth/Throat:     Pharynx: Uvula midline. No oropharyngeal exudate.  Eyes:     Conjunctiva/sclera: Conjunctivae normal.  Neck:     Thyroid: No thyromegaly.     Trachea: Trachea normal. No tracheal tenderness or tracheal deviation.  Cardiovascular:     Rate and Rhythm: Normal rate and regular rhythm.     Heart sounds: Normal heart sounds, S1 normal and S2 normal. No murmur.  Pulmonary:     Effort: No respiratory distress.     Breath sounds: Normal breath sounds. No stridor. No wheezing or rales.  Lymphadenopathy:     Head:     Right side of head: No tonsillar  adenopathy.     Left side of head: No tonsillar adenopathy.     Cervical: No cervical adenopathy.  Skin:    Findings: No erythema or rash.     Nails: There is no clubbing.   Neurological:     Mental Status: He is alert.     Diagnostics:    Spirometry was performed and demonstrated an FEV1 of 2.07 at 69 % of predicted.   Assessment and Plan:   1. Asthma, moderate persistent, well-controlled   2. Other allergic rhinitis   3. Food allergy   4. Gastroesophageal reflux disease, esophagitis presence not specified      1. Continue to perform Allergen avoidance measures as best as possible  2. Continue to Treat and prevent inflammation:   A.  Flonase 1-2 sprays each nostril 1 time per day  B.  montelukast 10 mg one tablet 1 time per day  C.  Symbicort 160 -2 inhalations 1-2 times per day   D.  Spiriva 1.25 Respimat - 2 inhalations one time per day  3. Continue therapy for reflux with omeprazole 20 mg 1-2 times a day  4. If needed:   A. Proventil/Pro-air/Ventolin 2 puffs every 4-6 hours if needed  B. Antihistamine - Claritin/Zyrtec/Allegra  C. EpiPen, Benadryl, M.D./ER for allergic reaction  5. Continue  immunotherapy and Epi-Pen.    6. Return to clinic in 6 months or earlier if problem  7. Obtain fall flu vaccine (and COVID vaccine)  Levert has actually done very well over the course of the past year while continuing on immunotherapy and anti-inflammatory agents administered to his airway and therapy directed against reflux and avoidance measures regarding shellfish.  We will keep him on the plan noted above.  He has a very good understanding of his dosing of Symbicort currently only using this medication in the evening.  He understands that the dose will be dependent on the degree of asthma activity and he can always go up to twice a day should he require an increased dose of Symbicort.  I will see him back in this clinic in 6 months or earlier if there is a problem.  Allena Katz, MD Allergy / Immunology [place]

## 2018-11-30 NOTE — Patient Instructions (Addendum)
  1. Continue to perform Allergen avoidance measures as best as possible  2. Continue to Treat and prevent inflammation:   A.  Flonase 1-2 sprays each nostril 1 time per day  B.  montelukast 10 mg one tablet 1 time per day  C.  Symbicort 160 -2 inhalations 1-2 times per day   D.  Spiriva 1.25 Respimat - 2 inhalations one time per day  3. Continue therapy for reflux with omeprazole 20 mg 1-2 times a day  4. If needed:   A. Proventil/Pro-air/Ventolin 2 puffs every 4-6 hours if needed  B. Antihistamine - Claritin/Zyrtec/Allegra  C. EpiPen, Benadryl, M.D./ER for allergic reaction  5. Continue immunotherapy and Epi-Pen.    6. Return to clinic in 6 months or earlier if problem  7. Obtain fall flu vaccine (and COVID vaccine)

## 2018-12-01 ENCOUNTER — Encounter: Payer: Self-pay | Admitting: Allergy and Immunology

## 2018-12-20 ENCOUNTER — Ambulatory Visit (INDEPENDENT_AMBULATORY_CARE_PROVIDER_SITE_OTHER): Payer: PRIVATE HEALTH INSURANCE

## 2018-12-20 DIAGNOSIS — J309 Allergic rhinitis, unspecified: Secondary | ICD-10-CM

## 2019-01-12 ENCOUNTER — Ambulatory Visit (INDEPENDENT_AMBULATORY_CARE_PROVIDER_SITE_OTHER): Payer: PRIVATE HEALTH INSURANCE | Admitting: *Deleted

## 2019-01-12 DIAGNOSIS — J309 Allergic rhinitis, unspecified: Secondary | ICD-10-CM | POA: Diagnosis not present

## 2019-01-31 ENCOUNTER — Ambulatory Visit (INDEPENDENT_AMBULATORY_CARE_PROVIDER_SITE_OTHER): Payer: PRIVATE HEALTH INSURANCE

## 2019-01-31 DIAGNOSIS — J309 Allergic rhinitis, unspecified: Secondary | ICD-10-CM

## 2019-02-09 ENCOUNTER — Ambulatory Visit (INDEPENDENT_AMBULATORY_CARE_PROVIDER_SITE_OTHER): Payer: No Typology Code available for payment source

## 2019-02-09 DIAGNOSIS — J309 Allergic rhinitis, unspecified: Secondary | ICD-10-CM | POA: Diagnosis not present

## 2019-02-23 ENCOUNTER — Ambulatory Visit (INDEPENDENT_AMBULATORY_CARE_PROVIDER_SITE_OTHER): Payer: No Typology Code available for payment source

## 2019-02-23 DIAGNOSIS — J309 Allergic rhinitis, unspecified: Secondary | ICD-10-CM | POA: Diagnosis not present

## 2019-03-09 ENCOUNTER — Ambulatory Visit (INDEPENDENT_AMBULATORY_CARE_PROVIDER_SITE_OTHER): Payer: No Typology Code available for payment source | Admitting: *Deleted

## 2019-03-09 DIAGNOSIS — J309 Allergic rhinitis, unspecified: Secondary | ICD-10-CM | POA: Diagnosis not present

## 2019-03-18 ENCOUNTER — Ambulatory Visit (INDEPENDENT_AMBULATORY_CARE_PROVIDER_SITE_OTHER): Payer: No Typology Code available for payment source

## 2019-03-18 DIAGNOSIS — J309 Allergic rhinitis, unspecified: Secondary | ICD-10-CM

## 2019-03-21 ENCOUNTER — Ambulatory Visit (INDEPENDENT_AMBULATORY_CARE_PROVIDER_SITE_OTHER): Payer: No Typology Code available for payment source

## 2019-03-21 DIAGNOSIS — J309 Allergic rhinitis, unspecified: Secondary | ICD-10-CM | POA: Diagnosis not present

## 2019-04-04 ENCOUNTER — Ambulatory Visit (INDEPENDENT_AMBULATORY_CARE_PROVIDER_SITE_OTHER): Payer: No Typology Code available for payment source

## 2019-04-04 DIAGNOSIS — J309 Allergic rhinitis, unspecified: Secondary | ICD-10-CM

## 2019-04-11 ENCOUNTER — Ambulatory Visit (INDEPENDENT_AMBULATORY_CARE_PROVIDER_SITE_OTHER): Payer: No Typology Code available for payment source

## 2019-04-11 DIAGNOSIS — J309 Allergic rhinitis, unspecified: Secondary | ICD-10-CM | POA: Diagnosis not present

## 2019-05-24 ENCOUNTER — Ambulatory Visit: Payer: PRIVATE HEALTH INSURANCE | Admitting: Allergy and Immunology

## 2019-05-30 ENCOUNTER — Ambulatory Visit (INDEPENDENT_AMBULATORY_CARE_PROVIDER_SITE_OTHER): Payer: No Typology Code available for payment source

## 2019-05-30 DIAGNOSIS — J309 Allergic rhinitis, unspecified: Secondary | ICD-10-CM | POA: Diagnosis not present

## 2019-05-31 ENCOUNTER — Encounter: Payer: Self-pay | Admitting: Allergy and Immunology

## 2019-05-31 ENCOUNTER — Other Ambulatory Visit: Payer: Self-pay

## 2019-05-31 ENCOUNTER — Ambulatory Visit (INDEPENDENT_AMBULATORY_CARE_PROVIDER_SITE_OTHER): Payer: No Typology Code available for payment source | Admitting: Allergy and Immunology

## 2019-05-31 DIAGNOSIS — J4540 Moderate persistent asthma, uncomplicated: Secondary | ICD-10-CM | POA: Diagnosis not present

## 2019-05-31 DIAGNOSIS — J3089 Other allergic rhinitis: Secondary | ICD-10-CM

## 2019-05-31 DIAGNOSIS — Z91018 Allergy to other foods: Secondary | ICD-10-CM | POA: Diagnosis not present

## 2019-05-31 DIAGNOSIS — K219 Gastro-esophageal reflux disease without esophagitis: Secondary | ICD-10-CM | POA: Diagnosis not present

## 2019-05-31 MED ORDER — MONTELUKAST SODIUM 10 MG PO TABS: 10.0000 mg | ORAL_TABLET | Freq: Every day | ORAL | 1 refills | Status: DC

## 2019-05-31 MED ORDER — ALBUTEROL SULFATE HFA 108 (90 BASE) MCG/ACT IN AERS: 2.0000 | INHALATION_SPRAY | Freq: Four times a day (QID) | RESPIRATORY_TRACT | 2 refills | Status: DC | PRN

## 2019-05-31 MED ORDER — TRELEGY ELLIPTA 200-62.5-25 MCG/INH IN AEPB: 1.0000 | INHALATION_SPRAY | Freq: Every day | RESPIRATORY_TRACT | 3 refills | Status: DC

## 2019-05-31 MED ORDER — OMEPRAZOLE 20 MG PO CPDR: DELAYED_RELEASE_CAPSULE | ORAL | 1 refills | Status: DC

## 2019-05-31 MED ORDER — FLUTICASONE PROPIONATE 50 MCG/ACT NA SUSP: NASAL | 1 refills | Status: DC

## 2019-05-31 MED ORDER — EPINEPHRINE 0.3 MG/0.3ML IJ SOAJ: INTRAMUSCULAR | 1 refills | Status: DC

## 2019-05-31 NOTE — Progress Notes (Signed)
[place] - [place] - [place] - [place] - [place]   Follow-up Note  Referring Provider: Pecolia Ades, MD Primary Provider: System, Pcp Not In Date of Office Visit: 05/31/2019  Subjective:   Harry Simmons (DOB: [birth year]) is a 59 y.o. male who [facility] in re-evaluation of the following:  HPI: Harry Simmons returns to this clinic in reevaluation of asthma and allergic rhinitis and history of food allergy directed against shellfish and history of reflux.  His last visit to this clinic was 30 November 2018.  He has had excellent control of his airway issue while consistently using anti-inflammatory agents for both his upper and lower airway.  He has not required a systemic steroid or an antibiotic for any type of airway issue.  His requirement for short acting bronchodilator is less than twice a week and he can exercise without any problem.  He continues on a combination of a nasal steroid and a leukotriene modifier and inhaled steroids, long-acting bronchodilator, and anticholinergic agent.  Immunotherapy is going quite well currently at every 3 weeks without any adverse effect.  Reflux is under excellent control while consistently using omeprazole mostly twice a day.  He remains away from consumption of shellfish.  He has received the flu vaccine and has received his first Moderna Covid vaccination.  Allergies as of 05/31/2019      Reactions   Codeine Hives, Rash   Tolerates Dilaudid.   Lobster [shellfish Allergy] Shortness Of Breath   Patient stated not all shellfish just lobster      Medication List      albuterol (2.5 MG/3ML) 0.083% nebulizer solution Commonly known as: PROVENTIL Take 3 mLs (2.5 mg total) by nebulization every 6 (six) hours as needed for wheezing or shortness of breath.   albuterol 108 (90 Base) MCG/ACT inhaler Commonly known as: VENTOLIN HFA Inhale 2 puffs into the lungs every 6 (six) hours as  needed.   budesonide-formoterol 160-4.5 MCG/ACT inhaler Commonly known as: SYMBICORT Inhale 2 puffs into the lungs 2 (two) times daily.   buPROPion 150 MG 12 hr tablet Commonly known as: WELLBUTRIN SR Take 150 mg by mouth daily.   clobetasol ointment 0.05 % Commonly known as: TEMOVATE Apply 1 application topically 2 (two) times daily.   dibucaine 1 % ointment Commonly known as: NUPERCAINAL Apply 1 application topically 3 (three) times daily as needed for pain.   docusate sodium 100 MG capsule Commonly known as: COLACE Take 200 mg by mouth at bedtime as needed for mild constipation.   EPINEPHrine 0.3 mg/0.3 mL Soaj injection Commonly known as: EPI-PEN Use as directed for severe allergic reaction   fluticasone 50 MCG/ACT nasal spray Commonly known as: FLONASE Place 2 sprays into both nostrils daily.   hydrocortisone 2.5 % cream Apply 1 application topically 2 (two) times daily.   ibuprofen 400 MG tablet Commonly known as: ADVIL Take 1 tablet (400 mg total) by mouth every 8 (eight) hours as needed.   lidocaine 4 % cream Commonly known as: LMX Apply 1 application topically 3 (three) times daily as needed.   loratadine 10 MG tablet Commonly known as: CLARITIN Take 10 mg by mouth daily as needed. For allergies   methocarbamol 750 MG tablet Commonly known as: ROBAXIN Take 750 mg by mouth at bedtime as needed for muscle spasms.   montelukast 10 MG tablet Commonly known as: SINGULAIR Take 1 tablet (10 mg total) by mouth at bedtime.   omeprazole 20 MG  capsule Commonly known as: PRILOSEC Take one capsule twice daily   sertraline 100 MG tablet Commonly known as: ZOLOFT Take 100 mg by mouth daily.   sildenafil 100 MG tablet Commonly known as: VIAGRA Take 100 mg by mouth daily as needed for erectile dysfunction.   Tiotropium Bromide Monohydrate 1.25 MCG/ACT Aers Commonly known as: Spiriva Respimat Inhale 2 puffs into the lungs daily.   traMADol 50 MG  tablet Commonly known as: ULTRAM Take 50 mg by mouth 2 (two) times daily.   traZODone 50 MG tablet Commonly known as: DESYREL Take 50 mg by mouth at bedtime.   zolpidem 10 MG tablet Commonly known as: AMBIEN Take 10 mg by mouth at bedtime as needed for sleep.       Past Medical History:  Diagnosis Date  . Allergic rhinitis   . Asthma   . GERD (gastroesophageal reflux disease)   . PTSD (post-traumatic stress disorder)     Past Surgical History:  Procedure Laterality Date  . CHOLECYSTECTOMY N/A 05/20/2012   Procedure: LAPAROSCOPIC CHOLECYSTECTOMY WITH INTRAOPERATIVE CHOLANGIOGRAM;  Surgeon: Robyne Askew, MD;  Location: [facility];  Service: General;  Laterality: N/A;  . COLONOSCOPY    . ERCP N/A 05/18/2012   Procedure: ENDOSCOPIC RETROGRADE CHOLANGIOPANCREATOGRAPHY (ERCP);  Surgeon: Theda Belfast, MD;  Location: [facility];  Service: Endoscopy;  Laterality: N/A;    Review of systems negative except as noted in HPI / PMHx or noted below:  Review of Systems  Constitutional: Negative.   HENT: Negative.   Eyes: Negative.   Respiratory: Negative.   Cardiovascular: Negative.   Gastrointestinal: Negative.   Genitourinary: Negative.   Musculoskeletal: Negative.   Skin: Negative.   Neurological: Negative.   Endo/Heme/Allergies: Negative.   Psychiatric/Behavioral: Negative.      Objective:   Vitals:   05/31/19 1100  BP: 130/82  Pulse: 67  Resp: 18  Temp: (!) 97.4 F (36.3 C)  SpO2: 97%          Physical Exam Constitutional:      Appearance: He is not diaphoretic.  HENT:     Head: Normocephalic.     Right Ear: Tympanic membrane, ear canal and external ear normal.     Left Ear: Tympanic membrane, ear canal and external ear normal.     Nose: Nose normal. No mucosal edema or rhinorrhea.     Mouth/Throat:     Pharynx: Uvula midline. No oropharyngeal exudate.  Eyes:     Conjunctiva/sclera: Conjunctivae normal.  Neck:     Thyroid: No thyromegaly.      Trachea: Trachea normal. No tracheal tenderness or tracheal deviation.  Cardiovascular:     Rate and Rhythm: Normal rate and regular rhythm.     Heart sounds: Normal heart sounds, S1 normal and S2 normal. No murmur.  Pulmonary:     Effort: No respiratory distress.     Breath sounds: Normal breath sounds. No stridor. No wheezing or rales.  Lymphadenopathy:     Head:     Right side of head: No tonsillar adenopathy.     Left side of head: No tonsillar adenopathy.     Cervical: No cervical adenopathy.  Skin:    Findings: No erythema or rash.     Nails: There is no clubbing.  Neurological:     Mental Status: He is alert.     Diagnostics:    Spirometry was performed and demonstrated an FEV1 of 3.08 at 104 % of predicted.  The patient had an Asthma  Control Test with the following results: ACT Total Score: 24.    Assessment and Plan:   1. Asthma, moderate persistent, well-controlled   2. Other allergic rhinitis   3. Food allergy   4. Gastroesophageal reflux disease, unspecified whether esophagitis present      1. Continue to perform Allergen avoidance measures as best as possible  2. Continue to Treat and prevent inflammation:   A.  Flonase 1-2 sprays each nostril 1 time per day  B.  montelukast 10 mg one tablet 1 time per day  C.  Trelegy 200 - 1 inhalation 1 time per day (Symbicort + Spiriva)  3. Continue to treat reflux with omeprazole 20 mg 1-2 times a day  4. If needed:   A. Proventil/Pro-air/Ventolin 2 puffs every 4-6 hours if needed  B. Antihistamine - Claritin/Zyrtec/Allegra  C. EpiPen, Benadryl, M.D./ER for allergic reaction  5. Continue immunotherapy and Epi-Pen.    6. Return to clinic in 6 months or earlier if problem  [facility] appears to be doing quite well on his current plan of anti-inflammatory agents for his airway and therapy directed against reflux and immunotherapy.  To make things more convenient we will consolidate his inhaled steroid and long-acting  bronchodilator and anticholinergic agent into Trelegy.  Assuming he does well with the plan noted above I will see him back in this clinic in 6 months or earlier if there is a problem.  Allena Katz, MD Allergy / Immunology [place]

## 2019-05-31 NOTE — Patient Instructions (Addendum)
  1. Continue to perform Allergen avoidance measures as best as possible  2. Continue to Treat and prevent inflammation:   A.  Flonase 1-2 sprays each nostril 1 time per day  B.  montelukast 10 mg one tablet 1 time per day  C.  Trelegy 200 - 1 inhalation 1 time per day (Symbicort + Spiriva)  3. Continue to treat reflux with omeprazole 20 mg 1-2 times a day  4. If needed:   A. Proventil/Pro-air/Ventolin 2 puffs every 4-6 hours if needed  B. Antihistamine - Claritin/Zyrtec/Allegra  C. EpiPen, Benadryl, M.D./ER for allergic reaction  5. Continue immunotherapy and Epi-Pen.    6. Return to clinic in 6 months or earlier if problem

## 2019-06-01 ENCOUNTER — Encounter: Payer: Self-pay | Admitting: Allergy and Immunology

## 2019-06-08 ENCOUNTER — Ambulatory Visit (INDEPENDENT_AMBULATORY_CARE_PROVIDER_SITE_OTHER): Payer: No Typology Code available for payment source | Admitting: *Deleted

## 2019-06-08 DIAGNOSIS — J309 Allergic rhinitis, unspecified: Secondary | ICD-10-CM | POA: Diagnosis not present

## 2019-07-04 ENCOUNTER — Ambulatory Visit (INDEPENDENT_AMBULATORY_CARE_PROVIDER_SITE_OTHER): Payer: No Typology Code available for payment source

## 2019-07-04 DIAGNOSIS — J309 Allergic rhinitis, unspecified: Secondary | ICD-10-CM

## 2019-07-18 DIAGNOSIS — J301 Allergic rhinitis due to pollen: Secondary | ICD-10-CM | POA: Diagnosis not present

## 2019-07-18 NOTE — Progress Notes (Signed)
VIALS EXP 07-17-20 

## 2019-07-19 DIAGNOSIS — J3089 Other allergic rhinitis: Secondary | ICD-10-CM | POA: Diagnosis not present

## 2019-08-16 ENCOUNTER — Other Ambulatory Visit: Payer: Self-pay | Admitting: Allergy and Immunology

## 2019-08-24 ENCOUNTER — Ambulatory Visit (INDEPENDENT_AMBULATORY_CARE_PROVIDER_SITE_OTHER): Payer: No Typology Code available for payment source | Admitting: *Deleted

## 2019-08-24 DIAGNOSIS — J309 Allergic rhinitis, unspecified: Secondary | ICD-10-CM

## 2019-09-13 ENCOUNTER — Ambulatory Visit (INDEPENDENT_AMBULATORY_CARE_PROVIDER_SITE_OTHER): Payer: No Typology Code available for payment source

## 2019-09-13 DIAGNOSIS — J309 Allergic rhinitis, unspecified: Secondary | ICD-10-CM | POA: Diagnosis not present

## 2019-09-21 ENCOUNTER — Ambulatory Visit: Payer: Self-pay

## 2019-09-21 NOTE — Progress Notes (Signed)
EXP 09/20/20 °

## 2019-09-22 DIAGNOSIS — J301 Allergic rhinitis due to pollen: Secondary | ICD-10-CM

## 2019-09-23 ENCOUNTER — Ambulatory Visit (INDEPENDENT_AMBULATORY_CARE_PROVIDER_SITE_OTHER): Payer: No Typology Code available for payment source

## 2019-09-23 DIAGNOSIS — J309 Allergic rhinitis, unspecified: Secondary | ICD-10-CM

## 2019-09-26 DIAGNOSIS — J3089 Other allergic rhinitis: Secondary | ICD-10-CM

## 2019-10-07 ENCOUNTER — Ambulatory Visit (INDEPENDENT_AMBULATORY_CARE_PROVIDER_SITE_OTHER): Payer: No Typology Code available for payment source

## 2019-10-07 DIAGNOSIS — J309 Allergic rhinitis, unspecified: Secondary | ICD-10-CM | POA: Diagnosis not present

## 2019-11-10 ENCOUNTER — Ambulatory Visit (INDEPENDENT_AMBULATORY_CARE_PROVIDER_SITE_OTHER): Payer: No Typology Code available for payment source

## 2019-11-10 DIAGNOSIS — J309 Allergic rhinitis, unspecified: Secondary | ICD-10-CM

## 2019-11-11 ENCOUNTER — Telehealth: Payer: Self-pay | Admitting: Allergy and Immunology

## 2019-11-11 NOTE — Telephone Encounter (Signed)
Sent request for new [facility] authorization to [facility], Faxed to both alternative numbers, [phone] and [phone].

## 2019-11-15 NOTE — Telephone Encounter (Signed)
Called VA to confirm receipt of request, they did not have it. VA gave me Darlin Drop contact [phone]. Left voicemail for Darl Pikes to call back.

## 2019-11-16 NOTE — Telephone Encounter (Signed)
VA called and gave me a general email to send request to, [email or web address]. Sent request to directed email.

## 2019-11-24 ENCOUNTER — Ambulatory Visit (INDEPENDENT_AMBULATORY_CARE_PROVIDER_SITE_OTHER): Payer: No Typology Code available for payment source | Admitting: *Deleted

## 2019-11-24 DIAGNOSIS — J309 Allergic rhinitis, unspecified: Secondary | ICD-10-CM

## 2019-12-02 ENCOUNTER — Ambulatory Visit (INDEPENDENT_AMBULATORY_CARE_PROVIDER_SITE_OTHER): Payer: No Typology Code available for payment source | Admitting: *Deleted

## 2019-12-02 DIAGNOSIS — J309 Allergic rhinitis, unspecified: Secondary | ICD-10-CM | POA: Diagnosis not present

## 2019-12-06 ENCOUNTER — Ambulatory Visit: Payer: No Typology Code available for payment source | Admitting: Allergy and Immunology

## 2019-12-14 ENCOUNTER — Ambulatory Visit (INDEPENDENT_AMBULATORY_CARE_PROVIDER_SITE_OTHER): Payer: No Typology Code available for payment source | Admitting: *Deleted

## 2019-12-14 DIAGNOSIS — J309 Allergic rhinitis, unspecified: Secondary | ICD-10-CM

## 2019-12-30 ENCOUNTER — Ambulatory Visit (INDEPENDENT_AMBULATORY_CARE_PROVIDER_SITE_OTHER): Payer: No Typology Code available for payment source | Admitting: *Deleted

## 2019-12-30 DIAGNOSIS — J309 Allergic rhinitis, unspecified: Secondary | ICD-10-CM | POA: Diagnosis not present

## 2020-01-10 ENCOUNTER — Ambulatory Visit: Payer: Self-pay | Admitting: *Deleted

## 2020-01-10 ENCOUNTER — Other Ambulatory Visit: Payer: Self-pay

## 2020-01-10 ENCOUNTER — Ambulatory Visit (INDEPENDENT_AMBULATORY_CARE_PROVIDER_SITE_OTHER): Payer: No Typology Code available for payment source | Admitting: Allergy and Immunology

## 2020-01-10 ENCOUNTER — Encounter: Payer: Self-pay | Admitting: Allergy and Immunology

## 2020-01-10 VITALS — BP 120/72 | HR 55 | Temp 98.1°F | Resp 16

## 2020-01-10 DIAGNOSIS — Z91018 Allergy to other foods: Secondary | ICD-10-CM

## 2020-01-10 DIAGNOSIS — J309 Allergic rhinitis, unspecified: Secondary | ICD-10-CM

## 2020-01-10 DIAGNOSIS — K219 Gastro-esophageal reflux disease without esophagitis: Secondary | ICD-10-CM | POA: Diagnosis not present

## 2020-01-10 DIAGNOSIS — J4540 Moderate persistent asthma, uncomplicated: Secondary | ICD-10-CM | POA: Diagnosis not present

## 2020-01-10 DIAGNOSIS — J3089 Other allergic rhinitis: Secondary | ICD-10-CM | POA: Diagnosis not present

## 2020-01-10 NOTE — Progress Notes (Signed)
[place] - [place] - [place] - [place] - [place]   Follow-up Note  Referring Provider: Orland Jarred* Primary Provider: Pcp, No Date of Office Visit: 01/10/2020  Subjective:   Harry Simmons (DOB: March 06, 1961) is a 59 y.o. male who [facility]2020 in re-evaluation of the following:  HPI: Harry Simmons returns to this clinic in evaluation of asthma and allergic rhinitis and food allergy directed against lobster and reflux.  His last visit to this clinic was 31 May 2019.  His asthma and all of his airway issue has been under excellent control since his last visit.  He has not required a systemic steroid or an antibiotic for any type of airway issue.  He continues to use a triple inhaler combination and continues on a nasal steroid and leukotriene modifier.  He can exert himself without any problem and rarely uses a short acting bronchodilator.  He continues on immunotherapy which is going quite well without any adverse effect.  This form of therapy has resulted in rather significant improvement regarding his respiratory tract issues.  Currently he is using this form of therapy at every 3 weeks interval.  His reflux is under very good control while using omeprazole mostly 1 time per day.  He remains away from consumption of lobster.  He has received 2 Moderna Covid vaccinations and a flu vaccine this year.  Allergies as of 01/10/2020      Reactions   Codeine Hives, Rash   Tolerates Dilaudid.   Lobster [shellfish Allergy] Shortness Of Breath   Patient stated not all shellfish just lobster      Medication List    albuterol (2.5 MG/3ML) 0.083% nebulizer solution Commonly known as: PROVENTIL Take 3 mLs (2.5 mg total) by nebulization every 6 (six) hours as needed for wheezing or shortness of breath.   albuterol 108 (90 Base) MCG/ACT inhaler Commonly known as: VENTOLIN HFA INHALE 2 PUFFS INTO THE LUNGS EVERY 6 HOURS AS NEEDED    budesonide-formoterol 160-4.5 MCG/ACT inhaler Commonly known as: SYMBICORT Inhale 2 puffs into the lungs 2 (two) times daily.   buPROPion 150 MG 12 hr tablet Commonly known as: WELLBUTRIN SR Take 150 mg by mouth daily.   clobetasol ointment 0.05 % Commonly known as: TEMOVATE Apply 1 application topically 2 (two) times daily.   dibucaine 1 % ointment Commonly known as: NUPERCAINAL Apply 1 application topically 3 (three) times daily as needed for pain.   docusate sodium 100 MG capsule Commonly known as: COLACE Take 200 mg by mouth at bedtime as needed for mild constipation.   EPINEPHrine 0.3 mg/0.3 mL Soaj injection Commonly known as: EPI-PEN Use as directed for severe allergic reaction   fluticasone 50 MCG/ACT nasal spray Commonly known as: FLONASE Use 1-2 sprays in each nostril once daily.   hydrocortisone 2.5 % cream Apply 1 application topically 2 (two) times daily.   ibuprofen 400 MG tablet Commonly known as: ADVIL Take 1 tablet (400 mg total) by mouth every 8 (eight) hours as needed.   lidocaine 4 % cream Commonly known as: LMX Apply 1 application topically 3 (three) times daily as needed.   loratadine 10 MG tablet Commonly known as: CLARITIN Take 10 mg by mouth daily as needed. For allergies   methocarbamol 750 MG tablet Commonly known as: ROBAXIN Take 750 mg by mouth at bedtime as needed for muscle spasms.   montelukast 10 MG tablet Commonly known as: SINGULAIR Take 1 tablet (10  mg total) by mouth at bedtime.   omeprazole 20 MG capsule Commonly known as: PRILOSEC Take one capsule twice daily   sertraline 100 MG tablet Commonly known as: ZOLOFT Take 100 mg by mouth daily.   sildenafil 100 MG tablet Commonly known as: VIAGRA Take 100 mg by mouth daily as needed for erectile dysfunction.   Tiotropium Bromide Monohydrate 1.25 MCG/ACT Aers Commonly known as: Spiriva Respimat Inhale 2 puffs into the lungs daily.   traMADol 50 MG tablet Commonly  known as: ULTRAM Take 50 mg by mouth 2 (two) times daily.   traZODone 50 MG tablet Commonly known as: DESYREL Take 50 mg by mouth at bedtime.   zolpidem 10 MG tablet Commonly known as: AMBIEN Take 10 mg by mouth at bedtime as needed for sleep.       Past Medical History:  Diagnosis Date  . Allergic rhinitis   . Asthma   . GERD (gastroesophageal reflux disease)   . PTSD (post-traumatic stress disorder)     Past Surgical History:  Procedure Laterality Date  . CHOLECYSTECTOMY N/A 05/20/2012   Procedure: LAPAROSCOPIC CHOLECYSTECTOMY WITH INTRAOPERATIVE CHOLANGIOGRAM;  Surgeon: Robyne Askew, MD;  Location: [facility];  Service: General;  Laterality: N/A;  . COLONOSCOPY    . ERCP N/A 05/18/2012   Procedure: ENDOSCOPIC RETROGRADE CHOLANGIOPANCREATOGRAPHY (ERCP);  Surgeon: Theda Belfast, MD;  Location: [facility];  Service: Endoscopy;  Laterality: N/A;    Review of systems negative except as noted in HPI / PMHx or noted below:  Review of Systems  Constitutional: Negative.   HENT: Negative.   Eyes: Negative.   Respiratory: Negative.   Cardiovascular: Negative.   Gastrointestinal: Negative.   Genitourinary: Negative.   Musculoskeletal: Negative.   Skin: Negative.   Neurological: Negative.   Endo/Heme/Allergies: Negative.   Psychiatric/Behavioral: Negative.      Objective:   Vitals:   01/10/20 1040  BP: 120/72  Pulse: (!) 55  Resp: 16  Temp: 98.1 F (36.7 C)  SpO2: 98%          Physical Exam Constitutional:      Appearance: He is not diaphoretic.  HENT:     Head: Normocephalic.     Right Ear: Tympanic membrane, ear canal and external ear normal.     Left Ear: Tympanic membrane, ear canal and external ear normal.     Nose: Nose normal. No mucosal edema or rhinorrhea.     Mouth/Throat:     Pharynx: Uvula midline. No oropharyngeal exudate.  Eyes:     Conjunctiva/sclera: Conjunctivae normal.  Neck:     Thyroid: No thyromegaly.     Trachea: Trachea normal.  No tracheal tenderness or tracheal deviation.  Cardiovascular:     Rate and Rhythm: Normal rate and regular rhythm.     Heart sounds: Normal heart sounds, S1 normal and S2 normal. No murmur heard.   Pulmonary:     Effort: No respiratory distress.     Breath sounds: Normal breath sounds. No stridor. No wheezing or rales.  Lymphadenopathy:     Head:     Right side of head: No tonsillar adenopathy.     Left side of head: No tonsillar adenopathy.     Cervical: No cervical adenopathy.  Skin:    Findings: No erythema or rash.     Nails: There is no clubbing.  Neurological:     Mental Status: He is alert.     Diagnostics:    Spirometry was performed and demonstrated an FEV1  of 2.82 at 82 % of predicted.  Assessment and Plan:   1. Asthma, moderate persistent, well-controlled   2. Other allergic rhinitis   3. Food allergy   4. Gastroesophageal reflux disease, unspecified whether esophagitis present      1. Continue to perform Allergen avoidance measures as best as possible  2. Continue to Treat and prevent inflammation:   A.  Flonase 1-2 sprays each nostril 1 time per day  B.  montelukast 10 mg one tablet 1 time per day  C.  Symbicort 160 - 2 inhalations 2 times per day  D.  Spiriva 1.25 Respimat - 2 inhalations 1 time per day  3. Continue to treat reflux with omeprazole 20 mg 1-2 times a day  4. If needed:   A. Proventil/Pro-air/Ventolin 2 puffs every 4-6 hours if needed  B. Antihistamine - Claritin/Zyrtec/Allegra  C. EpiPen, Benadryl, M.D./ER for allergic reaction  5. Continue immunotherapy and Epi-Pen.    6. Return to clinic in 6 months or earlier if problem   Harry Simmons is really doing very well and appears that he has been receiving significant benefit from the use of his immunotherapy.  He will continue on a collection of anti-inflammatory agents for his airway as noted above and continue to treat reflux with his proton pump inhibitor.  Assuming he does well with this  plan I will see him back in his clinic in 6 months or earlier if there is a problem.  Laurette Schimke, MD Allergy / Immunology [facility]

## 2020-01-10 NOTE — Patient Instructions (Signed)
    1. Continue to perform Allergen avoidance measures as best as possible  2. Continue to Treat and prevent inflammation:   A.  Flonase 1-2 sprays each nostril 1 time per day  B.  montelukast 10 mg one tablet 1 time per day  C.  Symbicort 160 - 2 inhalations 2 times per day  D.  Spiriva 1.25 Respimat - 2 inhalations 1 time per day  3. Continue to treat reflux with omeprazole 20 mg 1-2 times a day  4. If needed:   A. Proventil/Pro-air/Ventolin 2 puffs every 4-6 hours if needed  B. Antihistamine - Claritin/Zyrtec/Allegra  C. EpiPen, Benadryl, M.D./ER for allergic reaction  5. Continue immunotherapy and Epi-Pen.    6. Return to clinic in 6 months or earlier if problem

## 2020-01-11 ENCOUNTER — Encounter: Payer: Self-pay | Admitting: Allergy and Immunology

## 2020-01-17 ENCOUNTER — Ambulatory Visit (INDEPENDENT_AMBULATORY_CARE_PROVIDER_SITE_OTHER): Payer: No Typology Code available for payment source | Admitting: *Deleted

## 2020-01-17 DIAGNOSIS — J309 Allergic rhinitis, unspecified: Secondary | ICD-10-CM

## 2020-01-27 ENCOUNTER — Ambulatory Visit (INDEPENDENT_AMBULATORY_CARE_PROVIDER_SITE_OTHER): Payer: No Typology Code available for payment source

## 2020-01-27 DIAGNOSIS — J309 Allergic rhinitis, unspecified: Secondary | ICD-10-CM

## 2020-01-31 ENCOUNTER — Ambulatory Visit (INDEPENDENT_AMBULATORY_CARE_PROVIDER_SITE_OTHER): Payer: No Typology Code available for payment source

## 2020-01-31 DIAGNOSIS — J309 Allergic rhinitis, unspecified: Secondary | ICD-10-CM | POA: Diagnosis not present

## 2020-02-28 ENCOUNTER — Ambulatory Visit (INDEPENDENT_AMBULATORY_CARE_PROVIDER_SITE_OTHER): Payer: No Typology Code available for payment source | Admitting: *Deleted

## 2020-02-28 DIAGNOSIS — J309 Allergic rhinitis, unspecified: Secondary | ICD-10-CM | POA: Diagnosis not present

## 2020-03-28 ENCOUNTER — Ambulatory Visit (INDEPENDENT_AMBULATORY_CARE_PROVIDER_SITE_OTHER): Payer: No Typology Code available for payment source

## 2020-03-28 DIAGNOSIS — J309 Allergic rhinitis, unspecified: Secondary | ICD-10-CM | POA: Diagnosis not present

## 2020-04-30 ENCOUNTER — Ambulatory Visit (INDEPENDENT_AMBULATORY_CARE_PROVIDER_SITE_OTHER): Payer: No Typology Code available for payment source

## 2020-04-30 DIAGNOSIS — J309 Allergic rhinitis, unspecified: Secondary | ICD-10-CM | POA: Diagnosis not present

## 2020-06-05 ENCOUNTER — Ambulatory Visit (INDEPENDENT_AMBULATORY_CARE_PROVIDER_SITE_OTHER): Payer: No Typology Code available for payment source | Admitting: *Deleted

## 2020-06-05 DIAGNOSIS — J309 Allergic rhinitis, unspecified: Secondary | ICD-10-CM

## 2020-06-29 ENCOUNTER — Ambulatory Visit (INDEPENDENT_AMBULATORY_CARE_PROVIDER_SITE_OTHER): Payer: No Typology Code available for payment source

## 2020-06-29 DIAGNOSIS — J309 Allergic rhinitis, unspecified: Secondary | ICD-10-CM | POA: Diagnosis not present

## 2020-07-10 ENCOUNTER — Ambulatory Visit: Payer: No Typology Code available for payment source | Admitting: Allergy and Immunology

## 2020-08-10 ENCOUNTER — Ambulatory Visit (INDEPENDENT_AMBULATORY_CARE_PROVIDER_SITE_OTHER): Payer: No Typology Code available for payment source

## 2020-08-10 DIAGNOSIS — J309 Allergic rhinitis, unspecified: Secondary | ICD-10-CM

## 2020-08-15 NOTE — Progress Notes (Signed)
VIALS MADE & EXP 08-15-21 

## 2020-08-16 ENCOUNTER — Ambulatory Visit (INDEPENDENT_AMBULATORY_CARE_PROVIDER_SITE_OTHER): Payer: No Typology Code available for payment source | Admitting: *Deleted

## 2020-08-16 DIAGNOSIS — J309 Allergic rhinitis, unspecified: Secondary | ICD-10-CM

## 2020-08-17 DIAGNOSIS — J301 Allergic rhinitis due to pollen: Secondary | ICD-10-CM

## 2020-08-20 DIAGNOSIS — J3089 Other allergic rhinitis: Secondary | ICD-10-CM | POA: Diagnosis not present

## 2020-08-21 ENCOUNTER — Ambulatory Visit (INDEPENDENT_AMBULATORY_CARE_PROVIDER_SITE_OTHER): Payer: No Typology Code available for payment source

## 2020-08-21 DIAGNOSIS — J309 Allergic rhinitis, unspecified: Secondary | ICD-10-CM | POA: Diagnosis not present

## 2020-08-28 ENCOUNTER — Other Ambulatory Visit: Payer: Self-pay

## 2020-08-28 ENCOUNTER — Ambulatory Visit: Payer: Self-pay | Admitting: *Deleted

## 2020-08-28 ENCOUNTER — Ambulatory Visit (INDEPENDENT_AMBULATORY_CARE_PROVIDER_SITE_OTHER): Payer: No Typology Code available for payment source | Admitting: Allergy and Immunology

## 2020-08-28 ENCOUNTER — Encounter: Payer: Self-pay | Admitting: Allergy and Immunology

## 2020-08-28 VITALS — BP 128/76 | HR 77 | Temp 98.7°F | Resp 16 | Ht 67.0 in | Wt 239.4 lb

## 2020-08-28 DIAGNOSIS — Z91018 Allergy to other foods: Secondary | ICD-10-CM | POA: Diagnosis not present

## 2020-08-28 DIAGNOSIS — K219 Gastro-esophageal reflux disease without esophagitis: Secondary | ICD-10-CM | POA: Diagnosis not present

## 2020-08-28 DIAGNOSIS — J4541 Moderate persistent asthma with (acute) exacerbation: Secondary | ICD-10-CM

## 2020-08-28 DIAGNOSIS — J309 Allergic rhinitis, unspecified: Secondary | ICD-10-CM

## 2020-08-28 DIAGNOSIS — J3089 Other allergic rhinitis: Secondary | ICD-10-CM

## 2020-08-28 DIAGNOSIS — J4540 Moderate persistent asthma, uncomplicated: Secondary | ICD-10-CM | POA: Diagnosis not present

## 2020-08-28 MED ORDER — SPIRIVA RESPIMAT 1.25 MCG/ACT IN AERS: 2.0000 | INHALATION_SPRAY | Freq: Every day | RESPIRATORY_TRACT | 1 refills | Status: DC

## 2020-08-28 MED ORDER — BUDESONIDE-FORMOTEROL FUMARATE 160-4.5 MCG/ACT IN AERO: 2.0000 | INHALATION_SPRAY | Freq: Two times a day (BID) | RESPIRATORY_TRACT | 3 refills | Status: DC

## 2020-08-28 MED ORDER — MONTELUKAST SODIUM 10 MG PO TABS: 10.0000 mg | ORAL_TABLET | Freq: Every day | ORAL | 1 refills | Status: DC

## 2020-08-28 MED ORDER — OMEPRAZOLE 20 MG PO CPDR: DELAYED_RELEASE_CAPSULE | ORAL | 1 refills | Status: DC

## 2020-08-28 MED ORDER — ALBUTEROL SULFATE HFA 108 (90 BASE) MCG/ACT IN AERS: 2.0000 | INHALATION_SPRAY | Freq: Four times a day (QID) | RESPIRATORY_TRACT | 1 refills | Status: DC | PRN

## 2020-08-28 NOTE — Progress Notes (Signed)
[place] - [place] - [place] - [place] - [place]   Follow-up Note  Referring Provider: No ref. provider found Primary Provider: Pcp, No  Date of Office Visit: 08/28/2020  Subjective:   Harry Simmons (DOB: 1960-08-19) is a 60 y.o. male [facility] in re-evaluation of the following:  HPI: Harry Simmons returns to this clinic in evaluation of asthma and allergic rhinitis and food allergy directed against lobster and a history of reflux.  His last visit to this clinic was 10 January 2020.  Overall he has really done well with his airway issue.  He has not required a systemic steroid or an antibiotic for any type of airway issue.  He exerts himself with no problem while walking.  He has had very little problems with wheezing or coughing or shortness of breath or nasal congestion or sneezing and overall has gone through the spring and has done wonderful.  On occasion, he will have to use a short acting bronchodilator.  Certainly whenever he obtains a upper respiratory tract infection he gets a flare of his asthma but once again has not required any additional therapy beyond his chronic controller agent use.  He continues on immunotherapy currently at every 4 weeks.  His reflux is going quite well.  He continues on a proton pump inhibitor.  He does not consume lobster.  Allergies as of 08/28/2020       Reactions   Codeine Hives, Rash   Tolerates Dilaudid.   Lobster [shellfish Allergy] Shortness Of Breath   Patient stated not all shellfish just lobster        Medication List      albuterol (2.5 MG/3ML) 0.083% nebulizer solution Commonly known as: PROVENTIL Take 3 mLs (2.5 mg total) by nebulization every 6 (six) hours as needed for wheezing or shortness of breath.   albuterol 108 (90 Base) MCG/ACT inhaler Commonly known as: VENTOLIN HFA INHALE 2 PUFFS INTO THE LUNGS EVERY 6 HOURS AS NEEDED   budesonide-formoterol 160-4.5 MCG/ACT  inhaler Commonly known as: SYMBICORT Inhale 2 puffs into the lungs 2 (two) times daily.   buPROPion 150 MG 12 hr tablet Commonly known as: WELLBUTRIN SR Take 150 mg by mouth daily.   clobetasol ointment 0.05 % Commonly known as: TEMOVATE Apply 1 application topically 2 (two) times daily.   dibucaine 1 % ointment Commonly known as: NUPERCAINAL Apply 1 application topically 3 (three) times daily as needed for pain.   docusate sodium 100 MG capsule Commonly known as: COLACE Take 200 mg by mouth at bedtime as needed for mild constipation.   EPINEPHrine 0.3 mg/0.3 mL Soaj injection Commonly known as: EPI-PEN Use as directed for severe allergic reaction   fluticasone 50 MCG/ACT nasal spray Commonly known as: FLONASE Use 1-2 sprays in each nostril once daily.   hydrocortisone 2.5 % cream Apply 1 application topically 2 (two) times daily.   ibuprofen 400 MG tablet Commonly known as: ADVIL Take 1 tablet (400 mg total) by mouth every 8 (eight) hours as needed.   lidocaine 4 % cream Commonly known as: LMX Apply 1 application topically 3 (three) times daily as needed.   loratadine 10 MG tablet Commonly known as: CLARITIN Take 10 mg by mouth daily as needed. For allergies   methocarbamol 750 MG tablet Commonly known as: ROBAXIN Take 750 mg by mouth at bedtime as needed for muscle spasms.   montelukast 10 MG tablet Commonly known as: SINGULAIR Take 1 tablet (  10 mg total) by mouth at bedtime.   omeprazole 20 MG capsule Commonly known as: PRILOSEC Take one capsule twice daily   sertraline 100 MG tablet Commonly known as: ZOLOFT Take 100 mg by mouth daily.   sildenafil 100 MG tablet Commonly known as: VIAGRA Take 100 mg by mouth daily as needed for erectile dysfunction.   Tiotropium Bromide Monohydrate 1.25 MCG/ACT Aers Commonly known as: Spiriva Respimat Inhale 2 puffs into the lungs daily.   traMADol 50 MG tablet Commonly known as: ULTRAM Take 50 mg by mouth 2  (two) times daily.   traZODone 50 MG tablet Commonly known as: DESYREL Take 50 mg by mouth at bedtime.   zolpidem 10 MG tablet Commonly known as: AMBIEN Take 10 mg by mouth at bedtime as needed for sleep.        Past Medical History:  Diagnosis Date   Allergic rhinitis    Asthma    GERD (gastroesophageal reflux disease)    PTSD (post-traumatic stress disorder)     Past Surgical History:  Procedure Laterality Date   CHOLECYSTECTOMY N/A 05/20/2012   Procedure: LAPAROSCOPIC CHOLECYSTECTOMY WITH INTRAOPERATIVE CHOLANGIOGRAM;  Surgeon: Robyne Askew, MD;  Location: [facility];  Service: General;  Laterality: N/A;   COLONOSCOPY     ERCP N/A 05/18/2012   Procedure: ENDOSCOPIC RETROGRADE CHOLANGIOPANCREATOGRAPHY (ERCP);  Surgeon: Theda Belfast, MD;  Location: [facility];  Service: Endoscopy;  Laterality: N/A;    Review of systems negative except as noted in HPI / PMHx or noted below:  Review of Systems  Constitutional: Negative.   HENT: Negative.    Eyes: Negative.   Respiratory: Negative.    Cardiovascular: Negative.   Gastrointestinal: Negative.   Genitourinary: Negative.   Musculoskeletal: Negative.   Skin: Negative.   Neurological: Negative.   Endo/Heme/Allergies: Negative.   Psychiatric/Behavioral: Negative.      Objective:   Vitals:   08/28/20 1500  BP: 128/76  Pulse: 77  Resp: 16  Temp: 98.7 F (37.1 C)  SpO2: 96%   Height: 5\' 7"  (170.2 cm)  Weight: 239 lb 6.4 oz (108.6 kg)   Physical Exam Constitutional:      Appearance: He is not diaphoretic.  HENT:     Head: Normocephalic.     Right Ear: Tympanic membrane, ear canal and external ear normal.     Left Ear: Tympanic membrane, ear canal and external ear normal.     Nose: Nose normal. No mucosal edema or rhinorrhea.     Mouth/Throat:     Pharynx: Uvula midline. No oropharyngeal exudate.  Eyes:     Conjunctiva/sclera: Conjunctivae normal.  Neck:     Thyroid: No thyromegaly.     Trachea: Trachea  normal. No tracheal tenderness or tracheal deviation.  Cardiovascular:     Rate and Rhythm: Normal rate and regular rhythm.     Heart sounds: Normal heart sounds, S1 normal and S2 normal. No murmur heard. Pulmonary:     Effort: No respiratory distress.     Breath sounds: Normal breath sounds. No stridor. No wheezing or rales.  Lymphadenopathy:     Head:     Right side of head: No tonsillar adenopathy.     Left side of head: No tonsillar adenopathy.     Cervical: No cervical adenopathy.  Skin:    Findings: No erythema or rash.     Nails: There is no clubbing.  Neurological:     Mental Status: He is alert.    Diagnostics:  Spirometry was performed and demonstrated an FEV1 of 2.62 at 93 % of predicted.  The patient had an Asthma Control Test with the following results: ACT Total Score: 18.    Assessment and Plan:   1. Asthma, moderate persistent, well-controlled   2. Other allergic rhinitis   3. Food allergy   4. Gastroesophageal reflux disease, unspecified whether esophagitis present   5. Moderate persistent asthma with acute exacerbation      1. Continue to Treat and prevent inflammation:   A.  Flonase 1-2 sprays each nostril 1 time per day  B.  montelukast 10 mg one tablet 1 time per day  C.  Symbicort 160 - 2 inhalations 2 times per day  D.  Spiriva 1.25 Respimat - 2 inhalations 1 time per day  E.  Immunotherapy  2. Continue to treat reflux with omeprazole 20 mg 1-2 times a day  3. If needed:   A. Proventil/Pro-air/Ventolin 2 puffs every 4-6 hours if needed  B. Antihistamine - Claritin/Zyrtec/Allegra  C. EpiPen, Benadryl, M.D./ER for allergic reaction  4. Return to clinic in 6 months or earlier if problem  Harry Simmons appears to be doing relatively well at this point in time.  Immunotherapy is certainly helped his atopic respiratory disease and he has been able to go through this spring with very little difficulty.  He will continue to use immunotherapy as well as a  collection of other chronic controller agents for his respiratory tract atopic disease and he will also continue to treat reflux with omeprazole.  Assuming he does well with this plan I will see him back in this clinic in 6 months or earlier if there is a problem.   Laurette Schimke, MD Allergy / Immunology [facility]

## 2020-08-28 NOTE — Patient Instructions (Signed)
    1. Continue to Treat and prevent inflammation:   A.  Flonase 1-2 sprays each nostril 1 time per day  B.  montelukast 10 mg one tablet 1 time per day  C.  Symbicort 160 - 2 inhalations 2 times per day  D.  Spiriva 1.25 Respimat - 2 inhalations 1 time per day  E.  Immunotherapy  2. Continue to treat reflux with omeprazole 20 mg 1-2 times a day  3. If needed:   A. Proventil/Pro-air/Ventolin 2 puffs every 4-6 hours if needed  B. Antihistamine - Claritin/Zyrtec/Allegra  C. EpiPen, Benadryl, M.D./ER for allergic reaction  4. Return to clinic in 6 months or earlier if problem

## 2020-08-29 ENCOUNTER — Encounter: Payer: Self-pay | Admitting: Allergy and Immunology

## 2020-09-04 ENCOUNTER — Ambulatory Visit (INDEPENDENT_AMBULATORY_CARE_PROVIDER_SITE_OTHER): Payer: No Typology Code available for payment source | Admitting: *Deleted

## 2020-09-04 DIAGNOSIS — J309 Allergic rhinitis, unspecified: Secondary | ICD-10-CM | POA: Diagnosis not present

## 2020-09-27 ENCOUNTER — Ambulatory Visit (INDEPENDENT_AMBULATORY_CARE_PROVIDER_SITE_OTHER): Payer: No Typology Code available for payment source | Admitting: *Deleted

## 2020-09-27 DIAGNOSIS — J309 Allergic rhinitis, unspecified: Secondary | ICD-10-CM | POA: Diagnosis not present

## 2020-10-15 ENCOUNTER — Ambulatory Visit (INDEPENDENT_AMBULATORY_CARE_PROVIDER_SITE_OTHER): Payer: No Typology Code available for payment source | Admitting: *Deleted

## 2020-10-15 DIAGNOSIS — J309 Allergic rhinitis, unspecified: Secondary | ICD-10-CM | POA: Diagnosis not present

## 2020-10-23 ENCOUNTER — Ambulatory Visit (INDEPENDENT_AMBULATORY_CARE_PROVIDER_SITE_OTHER): Payer: No Typology Code available for payment source | Admitting: *Deleted

## 2020-10-23 DIAGNOSIS — J309 Allergic rhinitis, unspecified: Secondary | ICD-10-CM

## 2020-10-29 ENCOUNTER — Ambulatory Visit (INDEPENDENT_AMBULATORY_CARE_PROVIDER_SITE_OTHER): Payer: No Typology Code available for payment source | Admitting: *Deleted

## 2020-10-29 DIAGNOSIS — J309 Allergic rhinitis, unspecified: Secondary | ICD-10-CM

## 2020-11-07 ENCOUNTER — Telehealth: Payer: Self-pay | Admitting: Allergy and Immunology

## 2020-11-07 ENCOUNTER — Ambulatory Visit (INDEPENDENT_AMBULATORY_CARE_PROVIDER_SITE_OTHER): Payer: No Typology Code available for payment source

## 2020-11-07 DIAGNOSIS — J309 Allergic rhinitis, unspecified: Secondary | ICD-10-CM

## 2020-11-07 NOTE — Telephone Encounter (Signed)
Faxed renewal of authorization request to [facility], [phone] °

## 2020-11-14 ENCOUNTER — Ambulatory Visit (INDEPENDENT_AMBULATORY_CARE_PROVIDER_SITE_OTHER): Payer: No Typology Code available for payment source

## 2020-11-14 DIAGNOSIS — J309 Allergic rhinitis, unspecified: Secondary | ICD-10-CM | POA: Diagnosis not present

## 2020-11-16 NOTE — Telephone Encounter (Signed)
Emailed authorization request to [email or web address] on 11-13-2020.  Spoke to [place] at the [facility] [phone]), request has not been received. I have faxed to it the [facility] [phone]) and emailed [email or web address] today, 11-16-2020.

## 2020-11-20 NOTE — Telephone Encounter (Signed)
Called [facility] to check on authorization. They have not received it. I did let Chanel T know we have faxed [phone]) authorization and emailed ([email or web address]) it to the [place]. Chanel let me know it could be in the work queue especially since it was emailed. Chanel did advise me to follow up with pt's nurse Doylene Canning, [phone]) in a few days. I will follow up then.

## 2020-11-21 ENCOUNTER — Ambulatory Visit (INDEPENDENT_AMBULATORY_CARE_PROVIDER_SITE_OTHER): Payer: No Typology Code available for payment source

## 2020-11-21 DIAGNOSIS — J309 Allergic rhinitis, unspecified: Secondary | ICD-10-CM | POA: Diagnosis not present

## 2020-11-30 NOTE — Telephone Encounter (Signed)
Contacted pt's nurse at the [facility] [phone]) Patient's primary has received request and it is pending approval.

## 2020-12-12 NOTE — Telephone Encounter (Signed)
Contacted [facility], spoke to [employer] T; who provided me with nurse's number to call about authorization.   Called and left message with Nurse Doylene Canning, [phone] to check on authorization request.

## 2020-12-27 NOTE — Telephone Encounter (Signed)
Received email from nurse supervisor Recardo Evangelist, Pt's last authorization expired 11-23-2020 [phone]). And his new authorization is set for 12-21-2020 to 12-21-2021 [phone]). Synetta Fail also sent me both referrals with those dates, I have updated the referrals in our system.

## 2020-12-27 NOTE — Telephone Encounter (Signed)
Called Nurse Doylene Canning, [phone] and was directed immediately to a voicemail. Did not leave message and called [facility] [phone] ext [phone]) yesterday (12-26-2020), I spoke to Durenda Hurt M. I called to check on authorization. Durenda Hurt was able to let me know it had been approved for 12-21-2020 to 12-21-2021.   I did advise Durenda Hurt that patient had come in 11-21-2020 tor an injection, so I would need authorization to be backdated. Durenda Hurt did let me know the authorization had not been put in until 12-20-2020. I let Durenda Hurt know I had sent in authorization request 11-07-2020. Durenda Hurt advised me to leave message with Nurse Doylene Canning, I then let her know I had left a message 12-12-2020 and had not heard back from anyone at all.  Durenda Hurt then advised me to reach out to nurse supervisor Recardo Evangelist at [phone] or [email or web address] as this seems to be a recurrent issue with nurse Gold. Durenda Hurt advised me to send her an email with documentation I had sent authorization on 11-07-2020 and of me leaving voicemail.    I called Recardo Evangelist and left message as well as emailed [email or web address]) her directly with documentation.

## 2021-01-21 ENCOUNTER — Ambulatory Visit (INDEPENDENT_AMBULATORY_CARE_PROVIDER_SITE_OTHER): Payer: No Typology Code available for payment source

## 2021-01-21 DIAGNOSIS — J309 Allergic rhinitis, unspecified: Secondary | ICD-10-CM | POA: Diagnosis not present

## 2021-01-28 ENCOUNTER — Ambulatory Visit (INDEPENDENT_AMBULATORY_CARE_PROVIDER_SITE_OTHER): Payer: No Typology Code available for payment source | Admitting: *Deleted

## 2021-01-28 DIAGNOSIS — J309 Allergic rhinitis, unspecified: Secondary | ICD-10-CM | POA: Diagnosis not present

## 2021-02-04 ENCOUNTER — Ambulatory Visit (INDEPENDENT_AMBULATORY_CARE_PROVIDER_SITE_OTHER): Payer: No Typology Code available for payment source

## 2021-02-04 DIAGNOSIS — J309 Allergic rhinitis, unspecified: Secondary | ICD-10-CM

## 2021-02-11 ENCOUNTER — Ambulatory Visit (INDEPENDENT_AMBULATORY_CARE_PROVIDER_SITE_OTHER): Payer: No Typology Code available for payment source

## 2021-02-11 DIAGNOSIS — J309 Allergic rhinitis, unspecified: Secondary | ICD-10-CM

## 2021-02-18 DIAGNOSIS — J301 Allergic rhinitis due to pollen: Secondary | ICD-10-CM

## 2021-02-19 DIAGNOSIS — J3089 Other allergic rhinitis: Secondary | ICD-10-CM

## 2021-02-19 NOTE — Progress Notes (Signed)
VIALS MADE. EXP 02-19-22 

## 2021-03-05 ENCOUNTER — Ambulatory Visit: Payer: Federal, State, Local not specified - PPO | Admitting: Allergy and Immunology

## 2021-03-12 ENCOUNTER — Ambulatory Visit (INDEPENDENT_AMBULATORY_CARE_PROVIDER_SITE_OTHER): Payer: No Typology Code available for payment source | Admitting: *Deleted

## 2021-03-12 DIAGNOSIS — J309 Allergic rhinitis, unspecified: Secondary | ICD-10-CM

## 2021-04-11 ENCOUNTER — Ambulatory Visit (INDEPENDENT_AMBULATORY_CARE_PROVIDER_SITE_OTHER): Payer: No Typology Code available for payment source

## 2021-04-11 DIAGNOSIS — J309 Allergic rhinitis, unspecified: Secondary | ICD-10-CM | POA: Diagnosis not present

## 2021-05-13 ENCOUNTER — Ambulatory Visit (INDEPENDENT_AMBULATORY_CARE_PROVIDER_SITE_OTHER): Payer: No Typology Code available for payment source | Admitting: *Deleted

## 2021-05-13 DIAGNOSIS — J309 Allergic rhinitis, unspecified: Secondary | ICD-10-CM

## 2021-05-20 ENCOUNTER — Ambulatory Visit (INDEPENDENT_AMBULATORY_CARE_PROVIDER_SITE_OTHER): Payer: No Typology Code available for payment source

## 2021-05-20 DIAGNOSIS — J309 Allergic rhinitis, unspecified: Secondary | ICD-10-CM

## 2021-06-03 ENCOUNTER — Ambulatory Visit (INDEPENDENT_AMBULATORY_CARE_PROVIDER_SITE_OTHER): Payer: No Typology Code available for payment source

## 2021-06-03 DIAGNOSIS — J309 Allergic rhinitis, unspecified: Secondary | ICD-10-CM

## 2021-06-10 ENCOUNTER — Ambulatory Visit (INDEPENDENT_AMBULATORY_CARE_PROVIDER_SITE_OTHER): Payer: No Typology Code available for payment source

## 2021-06-10 DIAGNOSIS — J309 Allergic rhinitis, unspecified: Secondary | ICD-10-CM | POA: Diagnosis not present

## 2021-06-18 ENCOUNTER — Ambulatory Visit (INDEPENDENT_AMBULATORY_CARE_PROVIDER_SITE_OTHER): Payer: No Typology Code available for payment source

## 2021-06-18 DIAGNOSIS — J309 Allergic rhinitis, unspecified: Secondary | ICD-10-CM

## 2021-06-25 ENCOUNTER — Ambulatory Visit (INDEPENDENT_AMBULATORY_CARE_PROVIDER_SITE_OTHER): Payer: No Typology Code available for payment source

## 2021-06-25 DIAGNOSIS — J309 Allergic rhinitis, unspecified: Secondary | ICD-10-CM | POA: Diagnosis not present

## 2021-07-15 ENCOUNTER — Ambulatory Visit (INDEPENDENT_AMBULATORY_CARE_PROVIDER_SITE_OTHER): Payer: No Typology Code available for payment source

## 2021-07-15 DIAGNOSIS — J309 Allergic rhinitis, unspecified: Secondary | ICD-10-CM | POA: Diagnosis not present

## 2021-08-08 ENCOUNTER — Ambulatory Visit (INDEPENDENT_AMBULATORY_CARE_PROVIDER_SITE_OTHER): Payer: No Typology Code available for payment source

## 2021-08-08 DIAGNOSIS — J309 Allergic rhinitis, unspecified: Secondary | ICD-10-CM

## 2021-09-13 ENCOUNTER — Ambulatory Visit (INDEPENDENT_AMBULATORY_CARE_PROVIDER_SITE_OTHER): Payer: No Typology Code available for payment source | Admitting: *Deleted

## 2021-09-13 DIAGNOSIS — J309 Allergic rhinitis, unspecified: Secondary | ICD-10-CM | POA: Diagnosis not present

## 2021-10-14 ENCOUNTER — Ambulatory Visit (INDEPENDENT_AMBULATORY_CARE_PROVIDER_SITE_OTHER): Payer: No Typology Code available for payment source

## 2021-10-14 DIAGNOSIS — J309 Allergic rhinitis, unspecified: Secondary | ICD-10-CM

## 2021-10-17 DIAGNOSIS — J3081 Allergic rhinitis due to animal (cat) (dog) hair and dander: Secondary | ICD-10-CM

## 2021-10-17 NOTE — Progress Notes (Signed)
VIALS EXP 10-18-22 

## 2021-10-18 DIAGNOSIS — J3089 Other allergic rhinitis: Secondary | ICD-10-CM | POA: Diagnosis not present

## 2021-11-08 ENCOUNTER — Ambulatory Visit (INDEPENDENT_AMBULATORY_CARE_PROVIDER_SITE_OTHER): Payer: No Typology Code available for payment source | Admitting: *Deleted

## 2021-11-08 DIAGNOSIS — J309 Allergic rhinitis, unspecified: Secondary | ICD-10-CM

## 2021-12-09 ENCOUNTER — Ambulatory Visit (INDEPENDENT_AMBULATORY_CARE_PROVIDER_SITE_OTHER): Payer: No Typology Code available for payment source

## 2021-12-09 DIAGNOSIS — J309 Allergic rhinitis, unspecified: Secondary | ICD-10-CM | POA: Diagnosis not present

## 2021-12-10 ENCOUNTER — Ambulatory Visit: Payer: Federal, State, Local not specified - PPO | Admitting: Allergy and Immunology

## 2022-01-16 ENCOUNTER — Telehealth: Payer: Self-pay | Admitting: Allergy and Immunology

## 2022-01-16 NOTE — Telephone Encounter (Signed)
Faxed renewal of authorization request to [facility], [phone] and emailed it to [email or web address]. 

## 2022-01-20 ENCOUNTER — Ambulatory Visit (INDEPENDENT_AMBULATORY_CARE_PROVIDER_SITE_OTHER): Payer: No Typology Code available for payment source

## 2022-01-20 DIAGNOSIS — J309 Allergic rhinitis, unspecified: Secondary | ICD-10-CM

## 2022-01-28 ENCOUNTER — Ambulatory Visit (INDEPENDENT_AMBULATORY_CARE_PROVIDER_SITE_OTHER): Payer: No Typology Code available for payment source

## 2022-01-28 DIAGNOSIS — J309 Allergic rhinitis, unspecified: Secondary | ICD-10-CM | POA: Diagnosis not present

## 2022-01-28 NOTE — Telephone Encounter (Signed)
Received updated authorization. Has been updated in system.  

## 2022-02-03 ENCOUNTER — Ambulatory Visit (INDEPENDENT_AMBULATORY_CARE_PROVIDER_SITE_OTHER): Payer: No Typology Code available for payment source

## 2022-02-03 DIAGNOSIS — J309 Allergic rhinitis, unspecified: Secondary | ICD-10-CM

## 2022-02-04 ENCOUNTER — Encounter: Payer: Self-pay | Admitting: Allergy and Immunology

## 2022-02-04 ENCOUNTER — Other Ambulatory Visit: Payer: Self-pay

## 2022-02-04 ENCOUNTER — Ambulatory Visit (INDEPENDENT_AMBULATORY_CARE_PROVIDER_SITE_OTHER): Payer: No Typology Code available for payment source | Admitting: Allergy and Immunology

## 2022-02-04 VITALS — BP 128/84 | HR 52 | Temp 98.3°F | Resp 18 | Ht 68.0 in | Wt 241.5 lb

## 2022-02-04 DIAGNOSIS — J4540 Moderate persistent asthma, uncomplicated: Secondary | ICD-10-CM | POA: Diagnosis not present

## 2022-02-04 DIAGNOSIS — Z91018 Allergy to other foods: Secondary | ICD-10-CM | POA: Diagnosis not present

## 2022-02-04 DIAGNOSIS — K219 Gastro-esophageal reflux disease without esophagitis: Secondary | ICD-10-CM

## 2022-02-04 DIAGNOSIS — J3089 Other allergic rhinitis: Secondary | ICD-10-CM | POA: Diagnosis not present

## 2022-02-04 MED ORDER — BUDESONIDE-FORMOTEROL FUMARATE 160-4.5 MCG/ACT IN AERO: 2.0000 | INHALATION_SPRAY | Freq: Two times a day (BID) | RESPIRATORY_TRACT | 5 refills | Status: DC

## 2022-02-04 MED ORDER — MONTELUKAST SODIUM 10 MG PO TABS: 10.0000 mg | ORAL_TABLET | Freq: Every day | ORAL | 1 refills | Status: DC

## 2022-02-04 MED ORDER — EPINEPHRINE 0.3 MG/0.3ML IJ SOAJ: INTRAMUSCULAR | 1 refills | Status: DC

## 2022-02-04 MED ORDER — ALBUTEROL SULFATE HFA 108 (90 BASE) MCG/ACT IN AERS: 2.0000 | INHALATION_SPRAY | Freq: Four times a day (QID) | RESPIRATORY_TRACT | 1 refills | Status: DC | PRN

## 2022-02-04 MED ORDER — FLUTICASONE PROPIONATE 50 MCG/ACT NA SUSP: NASAL | 1 refills | Status: DC

## 2022-02-04 NOTE — Progress Notes (Unsigned)
- [place] - [place] - [place] - [place]   Follow-up Note  Referring Provider: Salvatore Decent, MD Primary Provider: Aviva Kluver Date of Office Visit: 02/04/2022  Subjective:   Harry Simmons (DOB: 06-Jun-1960) is a 61 y.o. male who [facility]22 in re-evaluation of the following:  HPI: Harry Simmons returns to this clinic in reevaluation of asthma, allergic rhinitis, and food allergy directed against lobster, and reflux.  I last saw him in this clinic 28 August 2020.  He continues to use immunotherapy every 4 weeks without any adverse effect and this is really resulted in rather significant improvement regarding both his upper and lower airway issue.  It does not sound as though he is required a systemic steroid or antibiotic for any type of airway issue.  He rarely uses a short acting bronchodilator.  It should be noted that he has stopped his Symbicort and stopped his Spiriva.  He will utilize these medications at point in time in which he would exercise but since he has not been exercising for quite a prolonged period in time he rarely uses any Symbicort or Spiriva.  He can easily go a month without using these medications.  His reflux is under good control on his current plan.  He did receive the flu vaccine.  Allergies as of 02/04/2022       Reactions   Codeine Hives, Rash   Tolerates Dilaudid.   Lobster [shellfish Allergy] Shortness Of Breath   Patient stated not all shellfish just lobster        Medication List    albuterol (2.5 MG/3ML) 0.083% nebulizer solution Commonly known as: PROVENTIL Take 3 mLs (2.5 mg total) by nebulization every 6 (six) hours as needed for wheezing or shortness of breath.   albuterol 108 (90 Base) MCG/ACT inhaler Commonly known as: VENTOLIN HFA Inhale 2 puffs into the lungs every 6 (six) hours as needed.   budesonide-formoterol 160-4.5 MCG/ACT inhaler Commonly known as: SYMBICORT Inhale 2  puffs into the lungs 2 (two) times daily.   buPROPion 150 MG 12 hr tablet Commonly known as: WELLBUTRIN SR Take 150 mg by mouth daily.   clobetasol ointment 0.05 % Commonly known as: TEMOVATE Apply 1 application topically 2 (two) times daily.   dibucaine 1 % ointment Commonly known as: NUPERCAINAL Apply 1 application topically 3 (three) times daily as needed for pain.   docusate sodium 100 MG capsule Commonly known as: COLACE Take 200 mg by mouth at bedtime as needed for mild constipation.   EPINEPHrine 0.3 mg/0.3 mL Soaj injection Commonly known as: EPI-PEN Use as directed for severe allergic reaction   fluticasone 50 MCG/ACT nasal spray Commonly known as: FLONASE Use 1-2 sprays in each nostril once daily.   hydrocortisone 2.5 % cream Apply 1 application topically 2 (two) times daily.   ibuprofen 400 MG tablet Commonly known as: ADVIL Take 1 tablet (400 mg total) by mouth every 8 (eight) hours as needed.   lidocaine 4 % cream Commonly known as: LMX Apply 1 application topically 3 (three) times daily as needed.   loratadine 10 MG tablet Commonly known as: CLARITIN Take 10 mg by mouth daily as needed. For allergies   methocarbamol 750 MG tablet Commonly known as: ROBAXIN Take 750 mg by mouth at bedtime as needed for muscle spasms.   montelukast 10 MG tablet Commonly known as: SINGULAIR Take 1 tablet (10 mg total) by mouth at bedtime.   omeprazole  20 MG capsule Commonly known as: PRILOSEC Take one capsule twice daily   sertraline 100 MG tablet Commonly known as: ZOLOFT Take 100 mg by mouth daily.   sildenafil 100 MG tablet Commonly known as: VIAGRA Take 100 mg by mouth daily as needed for erectile dysfunction.   Spiriva Respimat 1.25 MCG/ACT Aers Generic drug: Tiotropium Bromide Monohydrate Inhale 2 puffs into the lungs daily.   traMADol 50 MG tablet Commonly known as: ULTRAM Take 50 mg by mouth 2 (two) times daily.   traZODone 50 MG tablet Commonly  known as: DESYREL Take 50 mg by mouth at bedtime.   zolpidem 10 MG tablet Commonly known as: AMBIEN Take 10 mg by mouth at bedtime as needed for sleep.    Past Medical History:  Diagnosis Date   Allergic rhinitis    Asthma    GERD (gastroesophageal reflux disease)    PTSD (post-traumatic stress disorder)     Past Surgical History:  Procedure Laterality Date   CHOLECYSTECTOMY N/A 05/20/2012   Procedure: LAPAROSCOPIC CHOLECYSTECTOMY WITH INTRAOPERATIVE CHOLANGIOGRAM;  Surgeon: Robyne Askew, MD;  Location: [facility];  Service: General;  Laterality: N/A;   COLONOSCOPY     ERCP N/A 05/18/2012   Procedure: ENDOSCOPIC RETROGRADE CHOLANGIOPANCREATOGRAPHY (ERCP);  Surgeon: Theda Belfast, MD;  Location: [facility];  Service: Endoscopy;  Laterality: N/A;    Review of systems negative except as noted in HPI / PMHx or noted below:  Review of Systems  Constitutional: Negative.   HENT: Negative.    Eyes: Negative.   Respiratory: Negative.    Cardiovascular: Negative.   Gastrointestinal: Negative.   Genitourinary: Negative.   Musculoskeletal: Negative.   Skin: Negative.   Neurological: Negative.   Endo/Heme/Allergies: Negative.   Psychiatric/Behavioral: Negative.       Objective:   There were no vitals filed for this visit.        Physical Exam Constitutional:      Appearance: He is not diaphoretic.  HENT:     Head: Normocephalic.     Right Ear: Tympanic membrane, ear canal and external ear normal.     Left Ear: Tympanic membrane, ear canal and external ear normal.     Nose: Nose normal. No mucosal edema or rhinorrhea.     Mouth/Throat:     Pharynx: Uvula midline. No oropharyngeal exudate.  Eyes:     Conjunctiva/sclera: Conjunctivae normal.  Neck:     Thyroid: No thyromegaly.     Trachea: Trachea normal. No tracheal tenderness or tracheal deviation.  Cardiovascular:     Rate and Rhythm: Normal rate and regular rhythm.     Heart sounds: Normal heart sounds, S1 normal  and S2 normal. No murmur heard. Pulmonary:     Effort: No respiratory distress.     Breath sounds: Normal breath sounds. No stridor. No wheezing or rales.  Lymphadenopathy:     Head:     Right side of head: No tonsillar adenopathy.     Left side of head: No tonsillar adenopathy.     Cervical: No cervical adenopathy.  Skin:    Findings: No erythema or rash.     Nails: There is no clubbing.  Neurological:     Mental Status: He is alert.     Diagnostics:    Spirometry was performed and demonstrated an FEV1 of 2.13 at 73 % of predicted.  The patient had an Asthma Control Test with the following results: ACT Total Score: 25.    Assessment and Plan:  No diagnosis found.   1. Continue to Treat and prevent inflammation:   A.  Flonase 1-2 sprays each nostril 1 time per day  B.  montelukast 10 mg one tablet 1 time per day  C.  Symbicort 160 - 2 inhalations 1-2 times per day  D.  Immunotherapy  2. Continue to treat reflux with omeprazole 20 mg 1-2 times a day  3. If needed:   A. Proventil/Pro-air/Ventolin 2 puffs every 4-6 hours if needed  B. Antihistamine - Claritin/Zyrtec/Allegra  C. EpiPen, Benadryl, M.D./ER for allergic reaction  4. Return to clinic in 6 months or earlier if problem  Harry Simmons will continue on immunotherapy and I have encouraged him to use some dose of Symbicort as there is no doubt that his spirometry is somewhat depressed without the use of this medication and I suspect that once he does start to exercise again he is going to have a difficult time.  I did explain to him about the dual nature of using Symbicort including the delayed effect of using the inhaler steroid.  He will continue on other anti-inflammatory agents as noted above Dahlia Client continue to treat reflux and I will see him back in this clinic in 6 months or earlier if there is a problem.  Laurette Schimke, MD Allergy / Immunology [facility]

## 2022-02-04 NOTE — Patient Instructions (Signed)
    1. Continue to Treat and prevent inflammation:   A.  Flonase 1-2 sprays each nostril 1 time per day  B.  montelukast 10 mg one tablet 1 time per day  C.  Symbicort 160 - 2 inhalations 1-2 times per day  D.  Immunotherapy  2. Continue to treat reflux with omeprazole 20 mg 1-2 times a day  3. If needed:   A. Proventil/Pro-air/Ventolin 2 puffs every 4-6 hours if needed  B. Antihistamine - Claritin/Zyrtec/Allegra  C. EpiPen, Benadryl, M.D./ER for allergic reaction  4. Return to clinic in 6 months or earlier if problem

## 2022-02-05 ENCOUNTER — Encounter: Payer: Self-pay | Admitting: Allergy and Immunology

## 2022-02-18 ENCOUNTER — Ambulatory Visit (INDEPENDENT_AMBULATORY_CARE_PROVIDER_SITE_OTHER): Payer: No Typology Code available for payment source

## 2022-02-18 DIAGNOSIS — J309 Allergic rhinitis, unspecified: Secondary | ICD-10-CM | POA: Diagnosis not present

## 2022-02-25 ENCOUNTER — Ambulatory Visit (INDEPENDENT_AMBULATORY_CARE_PROVIDER_SITE_OTHER): Payer: No Typology Code available for payment source

## 2022-02-25 DIAGNOSIS — J309 Allergic rhinitis, unspecified: Secondary | ICD-10-CM

## 2022-03-11 ENCOUNTER — Ambulatory Visit (INDEPENDENT_AMBULATORY_CARE_PROVIDER_SITE_OTHER): Payer: No Typology Code available for payment source

## 2022-03-11 DIAGNOSIS — J309 Allergic rhinitis, unspecified: Secondary | ICD-10-CM | POA: Diagnosis not present

## 2022-03-17 ENCOUNTER — Ambulatory Visit (INDEPENDENT_AMBULATORY_CARE_PROVIDER_SITE_OTHER): Payer: No Typology Code available for payment source

## 2022-03-17 DIAGNOSIS — J309 Allergic rhinitis, unspecified: Secondary | ICD-10-CM | POA: Diagnosis not present

## 2022-04-05 ENCOUNTER — Other Ambulatory Visit: Payer: Self-pay | Admitting: Allergy and Immunology

## 2022-04-14 ENCOUNTER — Ambulatory Visit (INDEPENDENT_AMBULATORY_CARE_PROVIDER_SITE_OTHER): Payer: Federal, State, Local not specified - PPO

## 2022-04-14 DIAGNOSIS — J309 Allergic rhinitis, unspecified: Secondary | ICD-10-CM

## 2022-05-11 ENCOUNTER — Other Ambulatory Visit: Payer: Self-pay | Admitting: Allergy and Immunology

## 2022-05-13 ENCOUNTER — Ambulatory Visit (INDEPENDENT_AMBULATORY_CARE_PROVIDER_SITE_OTHER): Payer: No Typology Code available for payment source

## 2022-05-13 DIAGNOSIS — J309 Allergic rhinitis, unspecified: Secondary | ICD-10-CM | POA: Diagnosis not present

## 2022-06-05 ENCOUNTER — Ambulatory Visit (INDEPENDENT_AMBULATORY_CARE_PROVIDER_SITE_OTHER): Payer: No Typology Code available for payment source

## 2022-06-05 DIAGNOSIS — J309 Allergic rhinitis, unspecified: Secondary | ICD-10-CM

## 2022-07-04 ENCOUNTER — Ambulatory Visit (INDEPENDENT_AMBULATORY_CARE_PROVIDER_SITE_OTHER): Payer: No Typology Code available for payment source

## 2022-07-04 DIAGNOSIS — J309 Allergic rhinitis, unspecified: Secondary | ICD-10-CM

## 2022-07-09 DIAGNOSIS — J3081 Allergic rhinitis due to animal (cat) (dog) hair and dander: Secondary | ICD-10-CM | POA: Diagnosis not present

## 2022-07-09 NOTE — Progress Notes (Signed)
EXP 07/09/23 

## 2022-07-10 DIAGNOSIS — J3089 Other allergic rhinitis: Secondary | ICD-10-CM | POA: Diagnosis not present

## 2022-07-15 ENCOUNTER — Ambulatory Visit (INDEPENDENT_AMBULATORY_CARE_PROVIDER_SITE_OTHER): Payer: No Typology Code available for payment source | Admitting: Allergy and Immunology

## 2022-07-15 VITALS — BP 120/70 | HR 63 | Temp 98.8°F | Resp 18 | Ht 67.5 in | Wt 246.9 lb

## 2022-07-15 DIAGNOSIS — J4531 Mild persistent asthma with (acute) exacerbation: Secondary | ICD-10-CM

## 2022-07-15 DIAGNOSIS — J4540 Moderate persistent asthma, uncomplicated: Secondary | ICD-10-CM | POA: Diagnosis not present

## 2022-07-15 DIAGNOSIS — Z91018 Allergy to other foods: Secondary | ICD-10-CM | POA: Diagnosis not present

## 2022-07-15 DIAGNOSIS — K219 Gastro-esophageal reflux disease without esophagitis: Secondary | ICD-10-CM | POA: Diagnosis not present

## 2022-07-15 DIAGNOSIS — J3089 Other allergic rhinitis: Secondary | ICD-10-CM | POA: Diagnosis not present

## 2022-07-15 MED ORDER — ALBUTEROL SULFATE HFA 108 (90 BASE) MCG/ACT IN AERS: 2.0000 | INHALATION_SPRAY | Freq: Four times a day (QID) | RESPIRATORY_TRACT | 1 refills | Status: AC | PRN

## 2022-07-15 MED ORDER — FLUTICASONE PROPIONATE 50 MCG/ACT NA SUSP: 2.0000 | Freq: Every day | NASAL | 1 refills | Status: DC

## 2022-07-15 MED ORDER — OMEPRAZOLE MAGNESIUM 20 MG PO TBEC: 20.0000 mg | DELAYED_RELEASE_TABLET | Freq: Two times a day (BID) | ORAL | 1 refills | Status: DC

## 2022-07-15 MED ORDER — EPINEPHRINE 0.3 MG/0.3ML IJ SOAJ: INTRAMUSCULAR | 1 refills | Status: DC

## 2022-07-15 MED ORDER — BUDESONIDE-FORMOTEROL FUMARATE 160-4.5 MCG/ACT IN AERO: 2.0000 | INHALATION_SPRAY | Freq: Two times a day (BID) | RESPIRATORY_TRACT | 1 refills | Status: DC

## 2022-07-15 MED ORDER — CETIRIZINE HCL 10 MG PO TABS: 10.0000 mg | ORAL_TABLET | Freq: Every day | ORAL | 1 refills | Status: DC | PRN

## 2022-07-15 MED ORDER — MONTELUKAST SODIUM 10 MG PO TABS: 10.0000 mg | ORAL_TABLET | Freq: Every evening | ORAL | 1 refills | Status: DC

## 2022-07-15 MED ORDER — SPIRIVA RESPIMAT 1.25 MCG/ACT IN AERS: 2.0000 | INHALATION_SPRAY | Freq: Every morning | RESPIRATORY_TRACT | 1 refills | Status: AC

## 2022-07-15 MED ORDER — ALBUTEROL SULFATE (2.5 MG/3ML) 0.083% IN NEBU: 2.5000 mg | INHALATION_SOLUTION | Freq: Four times a day (QID) | RESPIRATORY_TRACT | 1 refills | Status: DC | PRN

## 2022-07-15 NOTE — Patient Instructions (Signed)
    1. Continue to Treat and prevent inflammation:   A.  Flonase 1-2 sprays each nostril 1 time per day  B.  montelukast 10 mg one tablet 1 time per day  C.  Symbicort 160 - 2 inhalations 1-2 times per day  D.  Spiriva 1.25 respimat - 2 inhalation 1 time per day  E.  Immunotherapy  2. If needed:   A. Proventil/Pro-air/Ventolin 2 puffs every 4-6 hours if needed  B. Antihistamine - Claritin/Zyrtec/Allegra  C. EpiPen, Benadryl, M.D./ER for allergic reaction  E. omeprazole 20 mg 1-2 times a day  3. Return to clinic in 6 months or earlier if problem

## 2022-07-15 NOTE — Progress Notes (Unsigned)
[name]   Follow-up Note  Referring Provider: Salvatore Decent, MD Primary Provider: Aviva Kluver Date of Office Visit: 07/15/2022  Subjective:   Harry Simmons (DOB: 12-02-1960) is a 62 y.o. male who [facility] in re-evaluation of the following:  HPI: [name] returns to this clinic in evaluation of asthma, allergic rhinitis, and food allergy directed against lobster, and reflux.  I last saw him in this clinic 04 February 2022.  He has had an excellent interval of time regarding his airway while using immunotherapy currently every 4 weeks and occasionally using some Spiriva and some Symbicort.  Rarely does he use a short acting bronchodilator and he can exert himself without any problem.  His nose has been doing well while using some Flonase and montelukast.  It does not sound as though he is required a systemic steroid or antibiotic for any type of airway issue.  His reflux is doing very well and he has been able to stop his omeprazole use on a consistent basis.  He does not eat a lot of start.  Allergies as of 07/15/2022       Reactions   Codeine Hives, Rash   Tolerates Dilaudid.   Lobster [shellfish Allergy] Shortness Of Breath   Patient stated not all shellfish just lobster        Medication List    albuterol (2.5 MG/3ML) 0.083% nebulizer solution Commonly known as: PROVENTIL Take 3 mLs (2.5 mg total) by nebulization every 6 (six) hours as needed for wheezing or shortness of breath.   albuterol 108 (90 Base) MCG/ACT inhaler Commonly known as: VENTOLIN HFA INHALE 2 PUFFS INTO THE LUNGS EVERY 6 HOURS AS NEEDED   budesonide-formoterol 160-4.5 MCG/ACT inhaler Commonly known as: SYMBICORT Inhale 2 puffs into the lungs 2 (two) times daily.   buPROPion 150 MG 12 hr tablet Commonly known as: WELLBUTRIN SR Take 150 mg by mouth daily.   Cholecalciferol 25 MCG (1000 UT) Chew Chew 1  tablet by mouth daily.   clobetasol ointment 0.05 % Commonly known as: TEMOVATE Apply 1 application topically 2 (two) times daily.   dibucaine 1 % ointment Commonly known as: NUPERCAINAL Apply 1 application topically 3 (three) times daily as needed for pain.   diclofenac Sodium 1 % Gel Commonly known as: VOLTAREN Apply 2 g topically in the morning and at bedtime.   docusate sodium 100 MG capsule Commonly known as: COLACE Take 200 mg by mouth at bedtime as needed for mild constipation.   EPINEPHrine 0.3 mg/0.3 mL Soaj injection Commonly known as: EPI-PEN Use as directed for severe allergic reaction   ferrous fumarate 325 (106 Fe) MG Tabs tablet Commonly known as: HEMOCYTE - 106 mg FE Take 1 tablet by mouth every other day.   fluticasone 50 MCG/ACT nasal spray Commonly known as: FLONASE SHAKE LIQUID AND USE 1 TO 2 SPRAYS IN EACH NOSTRIL EVERY DAY   hydrocortisone 2.5 % cream Apply 1 application topically 2 (two) times daily.   HYDROPHILIC EX Apply 1 Application topically in the morning and at bedtime.   ibuprofen 400 MG tablet Commonly known as: ADVIL Take 1 tablet (400 mg total) by mouth every 8 (eight) hours as needed.   lidocaine 4 % cream Commonly known as: LMX Apply 1 application topically 3 (three) times daily as needed.   loratadine 10 MG tablet Commonly known as: CLARITIN Take 10 mg by mouth daily as  needed. For allergies   methocarbamol 750 MG tablet Commonly known as: ROBAXIN Take 750 mg by mouth at bedtime as needed for muscle spasms.   montelukast 10 MG tablet Commonly known as: SINGULAIR TAKE 1 TABLET(10 MG) BY MOUTH AT BEDTIME   omeprazole 20 MG capsule Commonly known as: PRILOSEC Take one capsule twice daily   sertraline 100 MG tablet Commonly known as: ZOLOFT Take 100 mg by mouth daily.   sildenafil 100 MG tablet Commonly known as: VIAGRA Take 100 mg by mouth daily as needed for erectile dysfunction.   Spiriva Respimat 1.25 MCG/ACT  Aers Generic drug: Tiotropium Bromide Monohydrate Inhale 2 puffs into the lungs daily.   traMADol 50 MG tablet Commonly known as: ULTRAM Take 50 mg by mouth 2 (two) times daily.   traZODone 50 MG tablet Commonly known as: DESYREL Take 50 mg by mouth at bedtime.   zolpidem 10 MG tablet Commonly known as: AMBIEN Take 10 mg by mouth at bedtime as needed for sleep.    Past Medical History:  Diagnosis Date   Allergic rhinitis    Asthma    GERD (gastroesophageal reflux disease)    PTSD (post-traumatic stress disorder)     Past Surgical History:  Procedure Laterality Date   CHOLECYSTECTOMY N/A 05/20/2012   Procedure: LAPAROSCOPIC CHOLECYSTECTOMY WITH INTRAOPERATIVE CHOLANGIOGRAM;  Surgeon: Robyne Askew, MD;  Location: [facility];  Service: General;  Laterality: N/A;   COLONOSCOPY     ERCP N/A 05/18/2012   Procedure: ENDOSCOPIC RETROGRADE CHOLANGIOPANCREATOGRAPHY (ERCP);  Surgeon: Theda Belfast, MD;  Location: [facility];  Service: Endoscopy;  Laterality: N/A;    Review of systems negative except as noted in HPI / PMHx or noted below:  Review of Systems  Constitutional: Negative.   HENT: Negative.    Eyes: Negative.   Respiratory: Negative.    Cardiovascular: Negative.   Gastrointestinal: Negative.   Genitourinary: Negative.   Musculoskeletal: Negative.   Skin: Negative.   Neurological: Negative.   Endo/Heme/Allergies: Negative.   Psychiatric/Behavioral: Negative.       Objective:   Vitals:   07/15/22 1338  BP: 120/70  Pulse: 63  Resp: 18  Temp: 98.8 F (37.1 C)  SpO2: 98%   Height: 5' 7.5" (171.5 cm)  Weight: 246 lb 14.4 oz (112 kg)   Physical Exam Constitutional:      Appearance: He is not diaphoretic.  HENT:     Head: Normocephalic.     Right Ear: Tympanic membrane, ear canal and external ear normal.     Left Ear: Tympanic membrane, ear canal and external ear normal.     Nose: Nose normal. No mucosal edema or rhinorrhea.     Mouth/Throat:      Pharynx: Uvula midline. No oropharyngeal exudate.  Eyes:     Conjunctiva/sclera: Conjunctivae normal.  Neck:     Thyroid: No thyromegaly.     Trachea: Trachea normal. No tracheal tenderness or tracheal deviation.  Cardiovascular:     Rate and Rhythm: Normal rate and regular rhythm.     Heart sounds: Normal heart sounds, S1 normal and S2 normal. No murmur heard. Pulmonary:     Effort: No respiratory distress.     Breath sounds: Normal breath sounds. No stridor. No wheezing or rales.  Lymphadenopathy:     Head:     Right side of head: No tonsillar adenopathy.     Left side of head: No tonsillar adenopathy.     Cervical: No cervical adenopathy.  Skin:  Findings: No erythema or rash.     Nails: There is no clubbing.  Neurological:     Mental Status: He is alert.     Diagnostics:    Spirometry was performed and demonstrated an FEV1 of 2.05 at 72 % of predicted.  Assessment and Plan:   1. Asthma, moderate persistent, well-controlled   2. Other allergic rhinitis   3. Food allergy   4. Gastroesophageal reflux disease, unspecified whether esophagitis present     1. Continue to Treat and prevent inflammation:   A.  Flonase 1-2 sprays each nostril 1 time per day  B.  montelukast 10 mg one tablet 1 time per day  C.  Symbicort 160 - 2 inhalations 1-2 times per day  D.  Spiriva 1.25 respimat - 2 inhalation 1 time per day  E.  Immunotherapy  2. If needed:   A. Proventil/Pro-air/Ventolin 2 puffs every 4-6 hours if needed  B. Antihistamine - Claritin/Zyrtec/Allegra  C. EpiPen, Benadryl, M.D./ER for allergic reaction  E. omeprazole 20 mg 1-2 times a day  3. Return to clinic in 6 months or earlier if problem  [name] appears to be doing great and he has a very good understanding about his disease state and how his medications work and appropriate dosing of his medications depending on disease activity and he will continue on immunotherapy and a collection of anti-inflammatory agents  for his airway as noted above.  Fortunately, it looks like his reflux has improved significantly to the point where he does not need to use omeprazole on a regular basis and he can use this medication as needed.  Of course he will remain away from lobster consumption.  I will see him back in this clinic in 6 months.   Laurette Schimke, MD Allergy / Immunology  [facility]

## 2022-07-16 ENCOUNTER — Encounter: Payer: Self-pay | Admitting: Allergy and Immunology

## 2022-07-22 ENCOUNTER — Telehealth: Payer: Self-pay | Admitting: *Deleted

## 2022-07-22 NOTE — Telephone Encounter (Signed)
Received a fax from [place] [facility] stating that Symbicort is not covered and preferred alternative is Wixela, would you like switch the patient to this medication?

## 2022-07-24 ENCOUNTER — Other Ambulatory Visit: Payer: Self-pay | Admitting: *Deleted

## 2022-07-24 MED ORDER — WIXELA INHUB 250-50 MCG/ACT IN AEPB: 1.0000 | INHALATION_SPRAY | Freq: Two times a day (BID) | RESPIRATORY_TRACT | 5 refills | Status: DC

## 2022-07-24 NOTE — Telephone Encounter (Signed)
New prescription has been sent in. Called patient and left a voicemail asking for a return call to inform.

## 2022-07-28 NOTE — Telephone Encounter (Signed)
Called and spoke to patient and informed him of the medication change. Patient verbalized understanding and had no other questions or concerns at the tome of the call.

## 2022-07-30 ENCOUNTER — Ambulatory Visit (INDEPENDENT_AMBULATORY_CARE_PROVIDER_SITE_OTHER): Payer: No Typology Code available for payment source

## 2022-07-30 DIAGNOSIS — J309 Allergic rhinitis, unspecified: Secondary | ICD-10-CM | POA: Diagnosis not present

## 2022-08-05 ENCOUNTER — Ambulatory Visit: Payer: Federal, State, Local not specified - PPO | Admitting: Allergy and Immunology

## 2022-08-18 ENCOUNTER — Ambulatory Visit (INDEPENDENT_AMBULATORY_CARE_PROVIDER_SITE_OTHER): Payer: No Typology Code available for payment source

## 2022-08-18 DIAGNOSIS — J309 Allergic rhinitis, unspecified: Secondary | ICD-10-CM | POA: Diagnosis not present

## 2022-09-01 ENCOUNTER — Ambulatory Visit (INDEPENDENT_AMBULATORY_CARE_PROVIDER_SITE_OTHER): Payer: No Typology Code available for payment source | Admitting: *Deleted

## 2022-09-01 DIAGNOSIS — J309 Allergic rhinitis, unspecified: Secondary | ICD-10-CM | POA: Diagnosis not present

## 2022-09-09 ENCOUNTER — Ambulatory Visit (INDEPENDENT_AMBULATORY_CARE_PROVIDER_SITE_OTHER): Payer: No Typology Code available for payment source

## 2022-09-09 DIAGNOSIS — J309 Allergic rhinitis, unspecified: Secondary | ICD-10-CM

## 2022-09-15 ENCOUNTER — Ambulatory Visit (INDEPENDENT_AMBULATORY_CARE_PROVIDER_SITE_OTHER): Payer: No Typology Code available for payment source

## 2022-09-15 DIAGNOSIS — J309 Allergic rhinitis, unspecified: Secondary | ICD-10-CM

## 2022-09-23 ENCOUNTER — Ambulatory Visit (INDEPENDENT_AMBULATORY_CARE_PROVIDER_SITE_OTHER): Payer: No Typology Code available for payment source

## 2022-09-23 DIAGNOSIS — J309 Allergic rhinitis, unspecified: Secondary | ICD-10-CM | POA: Diagnosis not present

## 2022-10-03 ENCOUNTER — Ambulatory Visit (INDEPENDENT_AMBULATORY_CARE_PROVIDER_SITE_OTHER): Payer: No Typology Code available for payment source | Admitting: *Deleted

## 2022-10-03 DIAGNOSIS — J309 Allergic rhinitis, unspecified: Secondary | ICD-10-CM

## 2022-10-10 ENCOUNTER — Ambulatory Visit (INDEPENDENT_AMBULATORY_CARE_PROVIDER_SITE_OTHER): Payer: No Typology Code available for payment source

## 2022-10-10 DIAGNOSIS — J309 Allergic rhinitis, unspecified: Secondary | ICD-10-CM | POA: Diagnosis not present

## 2022-11-11 ENCOUNTER — Ambulatory Visit (INDEPENDENT_AMBULATORY_CARE_PROVIDER_SITE_OTHER): Payer: No Typology Code available for payment source | Admitting: *Deleted

## 2022-11-11 DIAGNOSIS — J309 Allergic rhinitis, unspecified: Secondary | ICD-10-CM | POA: Diagnosis not present

## 2022-12-08 ENCOUNTER — Ambulatory Visit (INDEPENDENT_AMBULATORY_CARE_PROVIDER_SITE_OTHER): Payer: No Typology Code available for payment source

## 2022-12-08 DIAGNOSIS — J309 Allergic rhinitis, unspecified: Secondary | ICD-10-CM | POA: Diagnosis not present

## 2023-01-09 ENCOUNTER — Ambulatory Visit (INDEPENDENT_AMBULATORY_CARE_PROVIDER_SITE_OTHER): Payer: No Typology Code available for payment source

## 2023-01-09 ENCOUNTER — Telehealth: Payer: Self-pay | Admitting: Allergy and Immunology

## 2023-01-09 DIAGNOSIS — J309 Allergic rhinitis, unspecified: Secondary | ICD-10-CM | POA: Diagnosis not present

## 2023-01-09 NOTE — Telephone Encounter (Signed)
Patient's authorization, [id number] expires 01-28-2023.   Faxed renewal of authorization request to [facility], [phone] and emailed it to [email or web address].   Called and left message for patient about expiration date.

## 2023-01-20 ENCOUNTER — Ambulatory Visit (INDEPENDENT_AMBULATORY_CARE_PROVIDER_SITE_OTHER): Payer: No Typology Code available for payment source | Admitting: Allergy and Immunology

## 2023-01-20 VITALS — BP 110/82 | HR 55 | Temp 98.2°F | Resp 14 | Ht 66.75 in | Wt 236.4 lb

## 2023-01-20 DIAGNOSIS — Z91018 Allergy to other foods: Secondary | ICD-10-CM | POA: Diagnosis not present

## 2023-01-20 DIAGNOSIS — J4540 Moderate persistent asthma, uncomplicated: Secondary | ICD-10-CM

## 2023-01-20 DIAGNOSIS — H6991 Unspecified Eustachian tube disorder, right ear: Secondary | ICD-10-CM

## 2023-01-20 DIAGNOSIS — J3089 Other allergic rhinitis: Secondary | ICD-10-CM

## 2023-01-20 DIAGNOSIS — H918X1 Other specified hearing loss, right ear: Secondary | ICD-10-CM

## 2023-01-20 MED ORDER — FLUTICASONE PROPIONATE 50 MCG/ACT NA SUSP: 2.0000 | Freq: Every day | NASAL | 1 refills | Status: DC

## 2023-01-20 MED ORDER — OMEPRAZOLE 20 MG PO CPDR: DELAYED_RELEASE_CAPSULE | ORAL | 1 refills | Status: DC

## 2023-01-20 MED ORDER — CETIRIZINE HCL 10 MG PO TABS: 10.0000 mg | ORAL_TABLET | Freq: Every day | ORAL | 1 refills | Status: DC | PRN

## 2023-01-20 MED ORDER — MONTELUKAST SODIUM 10 MG PO TABS: 10.0000 mg | ORAL_TABLET | Freq: Every evening | ORAL | 1 refills | Status: DC

## 2023-01-20 MED ORDER — BUDESONIDE-FORMOTEROL FUMARATE 160-4.5 MCG/ACT IN AERO: 2.0000 | INHALATION_SPRAY | Freq: Two times a day (BID) | RESPIRATORY_TRACT | 1 refills | Status: DC

## 2023-01-20 NOTE — Progress Notes (Unsigned)
[place] - [place] - [place] - [place] - [place]   Follow-up Note  Referring Provider: Salvatore Decent, MD Primary Provider: Aviva Kluver Date of Office Visit: 01/20/2023  Subjective:   Harry Simmons (DOB: 1960/10/07) is a 62 y.o. male who [facility] in re-evaluation of the following:  HPI: Harry Simmons returns to this clinic in evaluation of asthma, allergic rhinitis, food allergy directed against lobster, reflux.  I last saw him in this clinic 15 Jul 2022.  He has really done very well regarding his asthma and rarely uses a short acting bronchodilator while he continues on Symbicort.  He was on Spiriva in the past but no longer requires this medication.  He also continues to use immunotherapy currently at every 4 weeks without any adverse effect.  His nose is doing very well while using Flonase and montelukast.  It does not sound as though he has required a systemic steroid or an antibiotic for any type of airway issue.  He intermittently and rarely uses omeprazole for his reflux.  He is change his lifestyle significantly which has made a dramatic effect on his reflux.  He does not consume lobster.  He has obtained the RSV vaccine and the flu vaccine.  Allergies as of 01/20/2023       Reactions   Codeine Hives, Rash   Tolerates Dilaudid.   Lobster [shellfish Allergy] Shortness Of Breath   Patient stated not all shellfish just lobster        Medication List    albuterol (2.5 MG/3ML) 0.083% nebulizer solution Commonly known as: PROVENTIL Take 3 mLs (2.5 mg total) by nebulization every 6 (six) hours as needed for wheezing or shortness of breath.   albuterol 108 (90 Base) MCG/ACT inhaler Commonly known as: VENTOLIN HFA Inhale 2 puffs into the lungs every 6 (six) hours as needed.   budesonide-formoterol 160-4.5 MCG/ACT inhaler Commonly known as: SYMBICORT Inhale 2 puffs into the lungs 2 (two) times daily.   buPROPion 150 MG  12 hr tablet Commonly known as: WELLBUTRIN SR Take 150 mg by mouth daily.   cetirizine 10 MG tablet Commonly known as: ZYRTEC Take 1 tablet (10 mg total) by mouth daily as needed for allergies (Can take an extra dose during flare ups.).   Cholecalciferol 25 MCG (1000 UT) Chew Chew 1 tablet by mouth daily.   clobetasol ointment 0.05 % Commonly known as: TEMOVATE Apply 1 application topically 2 (two) times daily.   dibucaine 1 % ointment Commonly known as: NUPERCAINAL Apply 1 application topically 3 (three) times daily as needed for pain.   diclofenac Sodium 1 % Gel Commonly known as: VOLTAREN Apply 2 g topically in the morning and at bedtime.   docusate sodium 100 MG capsule Commonly known as: COLACE Take 200 mg by mouth at bedtime as needed for mild constipation.   EPINEPHrine 0.3 mg/0.3 mL Soaj injection Commonly known as: EPI-PEN Use as directed for severe allergic reaction   ferrous fumarate 325 (106 Fe) MG Tabs tablet Commonly known as: HEMOCYTE - 106 mg FE Take 1 tablet by mouth every other day.   fluticasone 50 MCG/ACT nasal spray Commonly known as: FLONASE Place 2 sprays into both nostrils daily. SHAKE LIQUID AND USE 1 TO 2 SPRAYS IN EACH NOSTRIL EVERY DAY   hydrocortisone 2.5 % cream Apply 1 application topically 2 (two) times daily.   HYDROPHILIC EX Apply 1 Application topically in the morning and at bedtime.   ibuprofen  400 MG tablet Commonly known as: ADVIL Take 1 tablet (400 mg total) by mouth every 8 (eight) hours as needed.   lidocaine 4 % cream Commonly known as: LMX Apply 1 application topically 3 (three) times daily as needed.   loratadine 10 MG tablet Commonly known as: CLARITIN Take 10 mg by mouth daily as needed. For allergies   methocarbamol 750 MG tablet Commonly known as: ROBAXIN Take 750 mg by mouth at bedtime as needed for muscle spasms.   montelukast 10 MG tablet Commonly known as: SINGULAIR Take 1 tablet (10 mg total) by mouth  at bedtime.   omeprazole 20 MG capsule Commonly known as: PRILOSEC Take one capsule twice daily   omeprazole 20 MG tablet Commonly known as: PriLOSEC OTC Take 1 tablet (20 mg total) by mouth 2 (two) times daily.   sertraline 100 MG tablet Commonly known as: ZOLOFT Take 100 mg by mouth daily.   sildenafil 100 MG tablet Commonly known as: VIAGRA Take 100 mg by mouth daily as needed for erectile dysfunction.   Spiriva Respimat 1.25 MCG/ACT Aers Generic drug: Tiotropium Bromide Monohydrate Inhale 2 puffs into the lungs in the morning.   traMADol 50 MG tablet Commonly known as: ULTRAM Take 50 mg by mouth 2 (two) times daily.   traZODone 50 MG tablet Commonly known as: DESYREL Take 50 mg by mouth at bedtime.   Wixela Inhub 250-50 MCG/ACT Aepb Generic drug: fluticasone-salmeterol Inhale 1 puff into the lungs in the morning and at bedtime.   zolpidem 10 MG tablet Commonly known as: AMBIEN Take 10 mg by mouth at bedtime as needed for sleep.        Past Medical History:  Diagnosis Date  . Allergic rhinitis   . Asthma   . GERD (gastroesophageal reflux disease)   . PTSD (post-traumatic stress disorder)     Past Surgical History:  Procedure Laterality Date  . CHOLECYSTECTOMY N/A 05/20/2012   Procedure: LAPAROSCOPIC CHOLECYSTECTOMY WITH INTRAOPERATIVE CHOLANGIOGRAM;  Surgeon: Robyne Askew, MD;  Location: [facility];  Service: General;  Laterality: N/A;  . COLONOSCOPY    . ERCP N/A 05/18/2012   Procedure: ENDOSCOPIC RETROGRADE CHOLANGIOPANCREATOGRAPHY (ERCP);  Surgeon: Theda Belfast, MD;  Location: [facility];  Service: Endoscopy;  Laterality: N/A;    Review of systems negative except as noted in HPI / PMHx or noted below:  Review of Systems  Constitutional: Negative.   HENT: Negative.    Eyes: Negative.   Respiratory: Negative.    Cardiovascular: Negative.   Gastrointestinal: Negative.   Genitourinary: Negative.   Musculoskeletal: Negative.   Skin: Negative.    Neurological: Negative.   Endo/Heme/Allergies: Negative.   Psychiatric/Behavioral: Negative.       Objective:   Vitals:   01/20/23 0851  BP: 110/82  Pulse: (!) 55  Resp: 14  Temp: 98.2 F (36.8 C)  SpO2: 97%   Height: 5' 6.75" (169.5 cm)  Weight: 236 lb 6.4 oz (107.2 kg)   Physical Exam Constitutional:      Appearance: He is not diaphoretic.  HENT:     Head: Normocephalic.     Right Ear: Tympanic membrane, ear canal and external ear normal.     Left Ear: Tympanic membrane, ear canal and external ear normal.     Nose: Nose normal. No mucosal edema or rhinorrhea.     Mouth/Throat:     Pharynx: Uvula midline. No oropharyngeal exudate.  Eyes:     Conjunctiva/sclera: Conjunctivae normal.  Neck:  Thyroid: No thyromegaly.     Trachea: Trachea normal. No tracheal tenderness or tracheal deviation.  Cardiovascular:     Rate and Rhythm: Normal rate and regular rhythm.     Heart sounds: Normal heart sounds, S1 normal and S2 normal. No murmur heard. Pulmonary:     Effort: No respiratory distress.     Breath sounds: Normal breath sounds. No stridor. No wheezing or rales.  Lymphadenopathy:     Head:     Right side of head: No tonsillar adenopathy.     Left side of head: No tonsillar adenopathy.     Cervical: No cervical adenopathy.  Skin:    Findings: No erythema or rash.     Nails: There is no clubbing.  Neurological:     Mental Status: He is alert.    Diagnostics: Spirometry was performed and demonstrated an FEV1 of 2.43 at 91 % of predicted.   Assessment and Plan:   No diagnosis found.  Patient Instructions     1. Continue to Treat and prevent inflammation:   A.  Flonase 1-2 sprays each nostril 1 time per day  B.  montelukast 10 mg one tablet 1 time per day  C.  Symbicort 160 - 2 inhalations 1-2 times per day  D.  Immunotherapy  2. If needed:   A. SYMBICORT 160 - 2 inhalations every 6 hours if needed (replaces albuterol)  B. Antihistamine -  Cetirizine  C. EpiPen, Benadryl, M.D./ER for allergic reaction  E. Omeprazole 20 mg 1-2 times a day  3. Have [facility] lok [facility] right ear  4. Return to clinic in 6 months or earlier if problem    Laurette Schimke, MD Allergy / Immunology  [facility]

## 2023-01-20 NOTE — Patient Instructions (Addendum)
    1. Continue to Treat and prevent inflammation:   A.  Flonase 1-2 sprays each nostril 1 time per day  B.  montelukast 10 mg one tablet 1 time per day  C.  Symbicort 160 - 2 inhalations 1-2 times per day  D.  Immunotherapy  2. If needed:   A. SYMBICORT 160 - 2 inhalations every 6 hours if needed (replaces albuterol)  B. Antihistamine - Cetirizine  C. EpiPen, Benadryl, M.D./ER for allergic reaction  E. Omeprazole 20 mg 1-2 times a day  3. [facility] look [facility] right [facility]  4. Return to clinic in 6 months or earlier if problem

## 2023-01-21 ENCOUNTER — Encounter: Payer: Self-pay | Admitting: Allergy and Immunology

## 2023-02-04 ENCOUNTER — Ambulatory Visit (INDEPENDENT_AMBULATORY_CARE_PROVIDER_SITE_OTHER): Payer: No Typology Code available for payment source

## 2023-02-04 DIAGNOSIS — J309 Allergic rhinitis, unspecified: Secondary | ICD-10-CM | POA: Diagnosis not present

## 2023-03-02 ENCOUNTER — Ambulatory Visit (INDEPENDENT_AMBULATORY_CARE_PROVIDER_SITE_OTHER): Payer: No Typology Code available for payment source

## 2023-03-02 DIAGNOSIS — J309 Allergic rhinitis, unspecified: Secondary | ICD-10-CM

## 2023-03-12 DIAGNOSIS — J3081 Allergic rhinitis due to animal (cat) (dog) hair and dander: Secondary | ICD-10-CM | POA: Diagnosis not present

## 2023-03-12 NOTE — Progress Notes (Signed)
 [facility] EXP 03-11-24

## 2023-03-13 DIAGNOSIS — J3089 Other allergic rhinitis: Secondary | ICD-10-CM | POA: Diagnosis not present

## 2023-04-02 ENCOUNTER — Ambulatory Visit (INDEPENDENT_AMBULATORY_CARE_PROVIDER_SITE_OTHER): Payer: No Typology Code available for payment source | Admitting: *Deleted

## 2023-04-02 DIAGNOSIS — J309 Allergic rhinitis, unspecified: Secondary | ICD-10-CM

## 2023-04-05 ENCOUNTER — Emergency Department (HOSPITAL_BASED_OUTPATIENT_CLINIC_OR_DEPARTMENT_OTHER)
Admission: EM | Admit: 2023-04-05 | Discharge: 2023-04-05 | Disposition: A | Discharge status: Home / Self Care | Payer: No Typology Code available for payment source | Attending: Emergency Medicine | Admitting: Emergency Medicine

## 2023-04-05 ENCOUNTER — Emergency Department (HOSPITAL_BASED_OUTPATIENT_CLINIC_OR_DEPARTMENT_OTHER): Payer: No Typology Code available for payment source

## 2023-04-05 ENCOUNTER — Encounter (HOSPITAL_BASED_OUTPATIENT_CLINIC_OR_DEPARTMENT_OTHER): Payer: Self-pay | Admitting: Emergency Medicine

## 2023-04-05 DIAGNOSIS — R319 Hematuria, unspecified: Secondary | ICD-10-CM | POA: Diagnosis present

## 2023-04-05 LAB — CBC WITH DIFFERENTIAL/PLATELET
Abs Immature Granulocytes: 0.01 10*3/uL (ref 0.00–0.07)
Basophils Absolute: 0 10*3/uL (ref 0.0–0.1)
Basophils Relative: 1 %
Eosinophils Absolute: 0.1 10*3/uL (ref 0.0–0.5)
Eosinophils Relative: 1 %
HCT: 44.2 % (ref 39.0–52.0)
Hemoglobin: 14.3 g/dL (ref 13.0–17.0)
Immature Granulocytes: 0 %
Lymphocytes Relative: 56 %
Lymphs Abs: 2.4 10*3/uL (ref 0.7–4.0)
MCH: 25.7 pg — ABNORMAL LOW (ref 26.0–34.0)
MCHC: 32.4 g/dL (ref 30.0–36.0)
MCV: 79.5 fL — ABNORMAL LOW (ref 80.0–100.0)
Monocytes Absolute: 0.3 10*3/uL (ref 0.1–1.0)
Monocytes Relative: 7 %
Neutro Abs: 1.5 10*3/uL — ABNORMAL LOW (ref 1.7–7.7)
Neutrophils Relative %: 35 %
Platelets: 295 10*3/uL (ref 150–400)
RBC: 5.56 MIL/uL (ref 4.22–5.81)
RDW: 15.2 % (ref 11.5–15.5)
WBC: 4.3 10*3/uL (ref 4.0–10.5)
nRBC: 0 % (ref 0.0–0.2)

## 2023-04-05 LAB — COMPREHENSIVE METABOLIC PANEL
ALT: 62 U/L — ABNORMAL HIGH (ref 0–44)
AST: 45 U/L — ABNORMAL HIGH (ref 15–41)
Albumin: 3.9 g/dL (ref 3.5–5.0)
Alkaline Phosphatase: 76 U/L (ref 38–126)
Anion gap: 8 (ref 5–15)
BUN: 16 mg/dL (ref 8–23)
CO2: 26 mmol/L (ref 22–32)
Calcium: 9 mg/dL (ref 8.9–10.3)
Chloride: 102 mmol/L (ref 98–111)
Creatinine, Ser: 1.02 mg/dL (ref 0.61–1.24)
GFR, Estimated: >60 mL/min (ref >60)
Glucose, Bld: 90 mg/dL (ref 70–99)
Potassium: 3.9 mmol/L (ref 3.5–5.1)
Sodium: 136 mmol/L (ref 135–145)
Total Bilirubin: 0.9 mg/dL (ref 0.0–1.2)
Total Protein: 7.9 g/dL (ref 6.5–8.1)

## 2023-04-05 LAB — URINALYSIS, ROUTINE W REFLEX MICROSCOPIC
Bilirubin Urine: NEGATIVE
Glucose, UA: NEGATIVE mg/dL
Ketones, ur: NEGATIVE mg/dL
Leukocytes,Ua: NEGATIVE
Nitrite: NEGATIVE
Protein, ur: 30 mg/dL — AB
Specific Gravity, Urine: 1.02 (ref 1.005–1.030)
pH: 6.5 (ref 5.0–8.0)

## 2023-04-05 LAB — URINALYSIS, MICROSCOPIC (REFLEX): RBC / HPF: >50 RBC/hpf (ref 0–5)

## 2023-04-05 NOTE — ED Provider Notes (Signed)
[place] [facility] Provider Note   CSN: [id number] Arrival date & time: 04/05/23  1139     History  Chief Complaint  Patient presents with   Hematuria    Harry Simmons is a 63 y.o. male.  Patient complains of blood in urine patient reports that he noticed blood with urination today.  He has not had any fever or chills he denies any chest pain he denies any abdominal pain.  Patient reports he has a past medical history of having had a kidney stone in the past.  He has never had any gallbladder disease.  Patient denies any history of urinary tract infection.  [facility]  The history is provided by the patient and the spouse. No language interpreter was used.  Hematuria       Home Medications Prior to Admission medications   Medication Sig Start Date End Date Taking? Authorizing Provider  albuterol (PROVENTIL) (2.5 MG/3ML) 0.083% nebulizer solution Take 3 mLs (2.5 mg total) by nebulization every 6 (six) hours as needed for wheezing or shortness of breath. 07/15/22   Kozlow, Alvira Philips, MD  albuterol (VENTOLIN HFA) 108 (90 Base) MCG/ACT inhaler Inhale 2 puffs into the lungs every 6 (six) hours as needed. 07/15/22   Kozlow, Alvira Philips, MD  budesonide-formoterol (SYMBICORT) 160-4.5 MCG/ACT inhaler Inhale 2 puffs into the lungs 2 (two) times daily. 01/20/23   Kozlow, Alvira Philips, MD  buPROPion (WELLBUTRIN SR) 150 MG 12 hr tablet Take 150 mg by mouth daily.    [provider]  cetirizine (ZYRTEC) 10 MG tablet Take 1 tablet (10 mg total) by mouth daily as needed for allergies (Can take an extra dose during flare ups.). 01/20/23   Kozlow, Alvira Philips, MD  Cholecalciferol 25 MCG (1000 UT) CHEW Chew 1 tablet by mouth daily. 04/07/22   [provider]  clobetasol ointment (TEMOVATE) 0.05 % Apply 1 application topically 2 (two) times daily.    [provider]  dibucaine (NUPERCAINAL) 1 % ointment Apply 1 application topically 3  (three) times daily as needed for pain.    [provider]  diclofenac Sodium (VOLTAREN) 1 % GEL Apply 2 g topically in the morning and at bedtime. 10/16/21   [provider]  docusate sodium (COLACE) 100 MG capsule Take 200 mg by mouth at bedtime as needed for mild constipation.    [provider]  EPINEPHrine 0.3 mg/0.3 mL IJ SOAJ injection Use as directed for severe allergic reaction 07/15/22   Kozlow, Alvira Philips, MD  ferrous fumarate (HEMOCYTE - 106 MG FE) 325 (106 Fe) MG TABS tablet Take 1 tablet by mouth every other day. 04/07/22   [provider]  fluticasone (FLONASE) 50 MCG/ACT nasal spray Place 2 sprays into both nostrils daily. SHAKE LIQUID AND USE 1 TO 2 SPRAYS IN EACH NOSTRIL EVERY DAY 01/20/23   Kozlow, Alvira Philips, MD  hydrocortisone 2.5 % cream Apply 1 application topically 2 (two) times daily.    [provider]  HYDROPHILIC EX Apply 1 Application topically in the morning and at bedtime. 04/07/22   [provider]  ibuprofen (ADVIL,MOTRIN) 400 MG tablet Take 1 tablet (400 mg total) by mouth every 8 (eight) hours as needed. 02/24/16   Azalia Bilis, MD  lidocaine (LMX) 4 % cream Apply 1 application topically 3 (three) times daily as needed.    [provider]  loratadine (CLARITIN) 10 MG tablet Take 10 mg by mouth daily  as needed. For allergies    [provider]  methocarbamol (ROBAXIN) 750 MG tablet Take 750 mg by mouth at bedtime as needed for muscle spasms.    [provider]  montelukast (SINGULAIR) 10 MG tablet Take 1 tablet (10 mg total) by mouth at bedtime. 01/20/23   Kozlow, Alvira Philips, MD  omeprazole (PRILOSEC OTC) 20 MG tablet Take 1 tablet (20 mg total) by mouth 2 (two) times daily. 07/15/22   Kozlow, Alvira Philips, MD  omeprazole (PRILOSEC) 20 MG capsule Take one capsule twice daily 01/20/23   Kozlow, Alvira Philips, MD  sertraline (ZOLOFT) 100 MG tablet Take 100 mg by mouth daily.    [provider]  sildenafil  (VIAGRA) 100 MG tablet Take 100 mg by mouth daily as needed for erectile dysfunction.    [provider]  Tiotropium Bromide Monohydrate (SPIRIVA RESPIMAT) 1.25 MCG/ACT AERS Inhale 2 puffs into the lungs in the morning. 07/15/22   Kozlow, Alvira Philips, MD  traMADol (ULTRAM) 50 MG tablet Take 50 mg by mouth 2 (two) times daily.    [provider]  traZODone (DESYREL) 50 MG tablet Take 50 mg by mouth at bedtime.    [provider]  Monte Fantasia INHUB 250-50 MCG/ACT AEPB Inhale 1 puff into the lungs in the morning and at bedtime. 07/24/22   Kozlow, Alvira Philips, MD  zolpidem (AMBIEN) 10 MG tablet Take 10 mg by mouth at bedtime as needed for sleep.    [provider]      Allergies    Codeine and Lobster [shellfish allergy]    Review of Systems   Review of Systems  Genitourinary:  Positive for hematuria.  All other systems reviewed and are negative.   Physical Exam Updated Vital Signs BP (!) 153/84 (BP Location: Left Arm)   Pulse (!) 57   Temp 98.1 F (36.7 C) (Oral)   Resp 17   Ht 5\' 8"  (1.727 m)   Wt 106.6 kg   SpO2 98%   BMI 35.73 kg/m  Physical Exam Vitals and nursing note reviewed.  Constitutional:      General: He is not in acute distress.    Appearance: He is well-developed.  HENT:     Head: Normocephalic and atraumatic.  Eyes:     Conjunctiva/sclera: Conjunctivae normal.  Cardiovascular:     Rate and Rhythm: Normal rate and regular rhythm.     Heart sounds: No murmur heard. Pulmonary:     Effort: Pulmonary effort is normal. No respiratory distress.     Breath sounds: Normal breath sounds.  Abdominal:     Palpations: Abdomen is soft.     Tenderness: There is no abdominal tenderness.  Musculoskeletal:        General: No swelling.     Cervical back: Neck supple.  Skin:    General: Skin is warm and dry.     Capillary Refill: Capillary refill takes less than 2 seconds.  Neurological:     Mental Status: He is alert.  Psychiatric:        Mood and  Affect: Mood normal.     ED Results / Procedures / Treatments   Labs (all labs ordered are listed, but only abnormal results are displayed) Labs Reviewed  URINALYSIS, ROUTINE W REFLEX MICROSCOPIC - Abnormal; Notable for the following components:      Result Value   APPearance HAZY (*)    Hgb urine dipstick LARGE (*)    Protein, ur 30 (*)    All  other components within normal limits  CBC WITH DIFFERENTIAL/PLATELET - Abnormal; Notable for the following components:   MCV 79.5 (*)    MCH 25.7 (*)    Neutro Abs 1.5 (*)    All other components within normal limits  COMPREHENSIVE METABOLIC PANEL - Abnormal; Notable for the following components:   AST 45 (*)    ALT 62 (*)    All other components within normal limits  URINALYSIS, MICROSCOPIC (REFLEX) - Abnormal; Notable for the following components:   Bacteria, UA RARE (*)    All other components within normal limits    EKG None  Radiology CT Renal Stone Study Result Date: 04/05/2023 CLINICAL DATA:  Abdominal/flank pain, stone suspected. EXAM: CT ABDOMEN AND PELVIS WITHOUT CONTRAST TECHNIQUE: Multidetector CT imaging of the abdomen and pelvis was performed following the standard protocol without IV contrast. RADIATION DOSE REDUCTION: This exam was performed according to the departmental dose-optimization program which includes automated exposure control, adjustment of the mA and/or kV according to patient size and/or use of iterative reconstruction technique. COMPARISON:  CT scan abdomen and pelvis from 05/17/2012. FINDINGS: Lower chest: The lung bases are clear. No pleural effusion. The heart is normal in size. No pericardial effusion. Hepatobiliary: The liver is normal in size. Non-cirrhotic configuration. No suspicious mass. These is mild diffuse hepatic steatosis. No intrahepatic or extrahepatic bile duct dilation. Gallbladder is surgically absent. Pancreas: Unremarkable. No pancreatic ductal dilatation or surrounding inflammatory  changes. Spleen: Within normal limits. No focal lesion. Adrenals/Urinary Tract: Adrenal glands are unremarkable. No suspicious renal mass within the limitations of this unenhanced exam. No nephroureterolithiasis or obstructive uropathy. Unremarkable urinary bladder. Stomach/Bowel: No disproportionate dilation of the small or large bowel loops. No evidence of abnormal bowel wall thickening or inflammatory changes. The appendix is unremarkable. Vascular/Lymphatic: No ascites or pneumoperitoneum. No abdominal or pelvic lymphadenopathy, by size criteria. No aneurysmal dilation of the major abdominal arteries. Reproductive: Normal size prostate. Symmetric seminal vesicles. Other: There are small fat containing umbilical and left inguinal hernias. The soft tissues and abdominal wall are otherwise unremarkable. Musculoskeletal: No suspicious osseous lesions. 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 IMPRESSION: 1. No nephroureterolithiasis or obstructive uropathy. 2. No acute inflammatory process identified within the abdomen or pelvis. 3. Multiple other nonacute observations, as described above. Electronically Signed   By: Jules Schick M.D.   On: 04/05/2023 15:54    Procedures Procedures    Medications Ordered in ED Medications - No data to display  ED Course/ Medical Decision Making/ A&P                                 Medical Decision Making Patient complains of blood in urine.  Amount and/or Complexity of Data Reviewed Independent Historian: spouse    Details: Patient is here with  his spouse who is supportive External Data Reviewed: notes.    Details: [facility].  Patient had normal PSA in April 2024 Labs: ordered. Decision-making details documented in ED Course.    Details: Labs ordered reviewed and interpreted.  UA shows large blood Radiology: ordered.    Details: CT renal no evidence of kidney stone or mass.  Risk Risk Details: Patient is counseled on results.   He is advised to schedule follow-up with urology.  He is advised to call tomorrow to schedule an appointment for evaluation.           Final Clinical Impression(s) / ED Diagnoses Final  diagnoses:  Hematuria, unspecified type    Rx / DC Orders ED Discharge Orders     None      An After Visit Summary was printed and given to the patient.    Elson Areas, [place] 04/05/23 Claria Dice    Pricilla Loveless, MD 04/06/23 1435

## 2023-04-05 NOTE — Discharge Instructions (Addendum)
Drink plenty of fluids

## 2023-04-05 NOTE — ED Notes (Signed)
Patient transported to CT

## 2023-04-05 NOTE — ED Triage Notes (Signed)
Pt reports hematuria since yesterday; denies pain; has ongoing issues with rectal bleeding, which [facility]; does not take blood thinners

## 2023-05-11 ENCOUNTER — Ambulatory Visit (INDEPENDENT_AMBULATORY_CARE_PROVIDER_SITE_OTHER): Admitting: *Deleted

## 2023-05-11 DIAGNOSIS — J309 Allergic rhinitis, unspecified: Secondary | ICD-10-CM

## 2023-05-18 ENCOUNTER — Ambulatory Visit (INDEPENDENT_AMBULATORY_CARE_PROVIDER_SITE_OTHER)

## 2023-05-18 DIAGNOSIS — J309 Allergic rhinitis, unspecified: Secondary | ICD-10-CM

## 2023-05-25 ENCOUNTER — Ambulatory Visit (INDEPENDENT_AMBULATORY_CARE_PROVIDER_SITE_OTHER): Admitting: *Deleted

## 2023-05-25 DIAGNOSIS — J309 Allergic rhinitis, unspecified: Secondary | ICD-10-CM | POA: Diagnosis not present

## 2023-06-09 ENCOUNTER — Ambulatory Visit (INDEPENDENT_AMBULATORY_CARE_PROVIDER_SITE_OTHER): Admitting: *Deleted

## 2023-06-09 DIAGNOSIS — J309 Allergic rhinitis, unspecified: Secondary | ICD-10-CM

## 2023-07-13 ENCOUNTER — Ambulatory Visit (INDEPENDENT_AMBULATORY_CARE_PROVIDER_SITE_OTHER)

## 2023-07-13 DIAGNOSIS — J309 Allergic rhinitis, unspecified: Secondary | ICD-10-CM

## 2023-07-21 ENCOUNTER — Ambulatory Visit (INDEPENDENT_AMBULATORY_CARE_PROVIDER_SITE_OTHER): Payer: No Typology Code available for payment source | Admitting: Allergy and Immunology

## 2023-07-21 VITALS — BP 136/88 | HR 52 | Temp 98.4°F | Resp 14 | Ht 66.73 in | Wt 245.3 lb

## 2023-07-21 DIAGNOSIS — J3089 Other allergic rhinitis: Secondary | ICD-10-CM | POA: Diagnosis not present

## 2023-07-21 DIAGNOSIS — Z91018 Allergy to other foods: Secondary | ICD-10-CM

## 2023-07-21 DIAGNOSIS — K219 Gastro-esophageal reflux disease without esophagitis: Secondary | ICD-10-CM | POA: Diagnosis not present

## 2023-07-21 DIAGNOSIS — J4540 Moderate persistent asthma, uncomplicated: Secondary | ICD-10-CM | POA: Diagnosis not present

## 2023-07-21 MED ORDER — MONTELUKAST SODIUM 10 MG PO TABS: 10.0000 mg | ORAL_TABLET | Freq: Every evening | ORAL | 1 refills | Status: DC

## 2023-07-21 MED ORDER — FLUTICASONE PROPIONATE 50 MCG/ACT NA SUSP: 2.0000 | Freq: Every morning | NASAL | 1 refills | Status: DC

## 2023-07-21 MED ORDER — EPINEPHRINE 0.3 MG/0.3ML IJ SOAJ: INTRAMUSCULAR | 1 refills | Status: AC

## 2023-07-21 MED ORDER — BUDESONIDE-FORMOTEROL FUMARATE 160-4.5 MCG/ACT IN AERO: 2.0000 | INHALATION_SPRAY | Freq: Two times a day (BID) | RESPIRATORY_TRACT | 1 refills | Status: DC

## 2023-07-21 MED ORDER — OMEPRAZOLE 20 MG PO CPDR: 20.0000 mg | DELAYED_RELEASE_CAPSULE | Freq: Two times a day (BID) | ORAL | 1 refills | Status: DC

## 2023-07-21 MED ORDER — CETIRIZINE HCL 10 MG PO TABS: 10.0000 mg | ORAL_TABLET | Freq: Every day | ORAL | 1 refills | Status: DC | PRN

## 2023-07-21 NOTE — Progress Notes (Unsigned)
 [place] [facility] [place] [facility] [place] [facility] [place] [facility] [place]   Follow-up Note  Referring Provider: Wava Hagedorn.,* Primary Provider: Alvis Jourdain, MD Date of Office Visit: 07/21/2023  Subjective:   Harry Simmons (DOB: 11/15/1960) is a 63 y.o. male who [facility] in re-evaluation of the following:  HPI: Harry Simmons returns to this clinic in evaluation of asthma, allergic rhinitis, food allergy directed against lobster, reflux.  I last saw him in his clinic 20 January 2023.  He continues to do very well with his asthma and has not required a systemic steroid treat exacerbation and rarely uses the short acting bronchodilator he can exert himself without any problem.  He is currently using Symbicort  1 time per day.  He has had very little problems with his upper airways while using Flonase  and montelukast  and immunotherapy.  Currently his immunotherapy is every 4 weeks without adverse effect.  He has not required an antibiotic to treat an episode of sinusitis.  His reflux is under very good control with omeprazole .  He does not consume lobster.  Allergies as of 07/21/2023       Reactions   Codeine Hives, Rash   Tolerates Dilaudid .   Lobster [shellfish Allergy] Shortness Of Breath   Patient stated not all shellfish just lobster   Dibucaine Rash        Medication List    albuterol  (2.5 MG/3ML) 0.083% nebulizer solution Commonly known as: PROVENTIL  Take 3 mLs (2.5 mg total) by nebulization every 6 (six) hours as needed for wheezing or shortness of breath.   albuterol  108 (90 Base) MCG/ACT inhaler Commonly known as: VENTOLIN  HFA Inhale 2 puffs into the lungs every 6 (six) hours as needed.   budesonide -formoterol  160-4.5 MCG/ACT inhaler Commonly known as: SYMBICORT  Inhale 2 puffs into the lungs 2 (two) times daily.   buPROPion 150 MG 12 hr tablet Commonly known as: WELLBUTRIN SR Take 150 mg by mouth daily.   cetirizine  10  MG tablet Commonly known as: ZYRTEC  Take 1 tablet (10 mg total) by mouth daily as needed for allergies (Can take an extra dose during flare ups.).   Cholecalciferol 25 MCG (1000 UT) Chew Chew 1 tablet by mouth daily.   clobetasol ointment 0.05 % Commonly known as: TEMOVATE Apply 1 application topically 2 (two) times daily.   dibucaine 1 % ointment Commonly known as: NUPERCAINAL Apply 1 application topically 3 (three) times daily as needed for pain.   diclofenac Sodium 1 % Gel Commonly known as: VOLTAREN Apply 2 g topically in the morning and at bedtime.   docusate sodium 100 MG capsule Commonly known as: COLACE Take 200 mg by mouth at bedtime as needed for mild constipation.   EPINEPHrine  0.3 mg/0.3 mL Soaj injection Commonly known as: EPI-PEN Use as directed for severe allergic reaction   ferrous fumarate 325 (106 Fe) MG Tabs tablet Commonly known as: HEMOCYTE - 106 mg FE Take 1 tablet by mouth every other day.   fluticasone  50 MCG/ACT nasal spray Commonly known as: FLONASE  Place 2 sprays into both nostrils daily. SHAKE LIQUID AND USE 1 TO 2 SPRAYS IN EACH NOSTRIL EVERY DAY   hydrocortisone 2.5 % cream Apply 1 application topically 2 (two) times daily.   HYDROPHILIC EX Apply 1 Application topically in the morning and at bedtime.   HYDROPHILIC EX Apply 1 Application topically in the morning and at bedtime.   loratadine  10 MG tablet Commonly known as: CLARITIN  Take  10 mg by mouth daily as needed. For allergies   mesalamine 1000 MG suppository Commonly known as: CANASA Place 1,000 mg rectally at bedtime.   methocarbamol 750 MG tablet Commonly known as: ROBAXIN Take 750 mg by mouth at bedtime as needed for muscle spasms.   montelukast  10 MG tablet Commonly known as: SINGULAIR  Take 1 tablet (10 mg total) by mouth at bedtime.   omeprazole  20 MG capsule Commonly known as: PRILOSEC  Take one capsule twice daily   omeprazole  20 MG tablet Commonly known as:  PriLOSEC  OTC Take 1 tablet (20 mg total) by mouth 2 (two) times daily.   sertraline  100 MG tablet Commonly known as: ZOLOFT  Take 100 mg by mouth daily.   sildenafil 100 MG tablet Commonly known as: VIAGRA Take 100 mg by mouth daily as needed for erectile dysfunction.   Spiriva  Respimat 1.25 MCG/ACT Aers Generic drug: Tiotropium Bromide  Monohydrate Inhale 2 puffs into the lungs in the morning.   traMADol 50 MG tablet Commonly known as: ULTRAM Take 50 mg by mouth 2 (two) times daily.   traZODone 50 MG tablet Commonly known as: DESYREL Take 50 mg by mouth at bedtime.   Wixela Inhub  250-50 MCG/ACT Aepb Generic drug: fluticasone -salmeterol Inhale 1 puff into the lungs in the morning and at bedtime.   zolpidem 10 MG tablet Commonly known as: AMBIEN Take 10 mg by mouth at bedtime as needed for sleep.    Past Medical History:  Diagnosis Date   Allergic rhinitis    Asthma    GERD (gastroesophageal reflux disease)    PTSD (post-traumatic stress disorder)     Past Surgical History:  Procedure Laterality Date   CHOLECYSTECTOMY N/A 05/20/2012   Procedure: LAPAROSCOPIC CHOLECYSTECTOMY WITH INTRAOPERATIVE CHOLANGIOGRAM;  Surgeon: Mayme Spearman, MD;  Location: [facility];  Service: General;  Laterality: N/A;   COLONOSCOPY     ERCP N/A 05/18/2012   Procedure: ENDOSCOPIC RETROGRADE CHOLANGIOPANCREATOGRAPHY (ERCP);  Surgeon: Almeda Aris, MD;  Location: [facility];  Service: Endoscopy;  Laterality: N/A;    Review of systems negative except as noted in HPI / PMHx or noted below:  Review of Systems  Constitutional: Negative.   HENT: Negative.    Eyes: Negative.   Respiratory: Negative.    Cardiovascular: Negative.   Gastrointestinal: Negative.   Genitourinary: Negative.   Musculoskeletal: Negative.   Skin: Negative.   Neurological: Negative.   Endo/Heme/Allergies: Negative.   Psychiatric/Behavioral: Negative.       Objective:   Vitals:   07/21/23 0941  BP: 136/88   Pulse: (!) 52  Resp: 14  Temp: 98.4 F (36.9 C)  SpO2: 96%   Height: 5' 6.73" (169.5 cm)  Weight: 245 lb 4.8 oz (111.3 kg)   Physical Exam Constitutional:      Appearance: He is not diaphoretic.  HENT:     Head: Normocephalic.     Right Ear: Tympanic membrane, ear canal and external ear normal.     Left Ear: Tympanic membrane, ear canal and external ear normal.     Nose: Nose normal. No mucosal edema or rhinorrhea.     Mouth/Throat:     Pharynx: Uvula midline. No oropharyngeal exudate.  Eyes:     Conjunctiva/sclera: Conjunctivae normal.  Neck:     Thyroid: No thyromegaly.     Trachea: Trachea normal. No tracheal tenderness or tracheal deviation.  Cardiovascular:     Rate and Rhythm: Normal rate and regular rhythm.     Heart sounds: Normal heart sounds, S1  normal and S2 normal. No murmur heard. Pulmonary:     Effort: No respiratory distress.     Breath sounds: Normal breath sounds. No stridor. No wheezing or rales.  Lymphadenopathy:     Head:     Right side of head: No tonsillar adenopathy.     Left side of head: No tonsillar adenopathy.     Cervical: No cervical adenopathy.  Skin:    Findings: No erythema or rash.     Nails: There is no clubbing.  Neurological:     Mental Status: He is alert.     Diagnostics: Spirometry was performed and demonstrated an FEV1 of 2.09 at 79 % of predicted.  Assessment and Plan:   1. Asthma, moderate persistent, well-controlled   2. Other allergic rhinitis   3. Food allergy   4. Gastroesophageal reflux disease, unspecified whether esophagitis present     1. Continue to Treat and prevent inflammation:   A.  Flonase  1-2 sprays each nostril 1 time per day  B.  montelukast  10 mg one tablet 1 time per day  C.  Symbicort  160 - 2 inhalations 1-2 times per day  D.  Immunotherapy  2. If needed:   A. SYMBICORT  160 - 2 inhalations every 6 hours if needed (replaces albuterol )  B. Antihistamine - Cetirizine   C. EpiPen , Benadryl ,  M.D./ER for allergic reaction  E. Omeprazole  20 mg 1-2 times a day  3. Influenza = Tamiflu. Covid = Paxlovid  4. Return to clinic in 6 months or earlier if problem  Harry Simmons appears to be doing quite well on his current therapy which includes a collection of anti-inflammatory agents for both his upper and lower airway and immunotherapy.  As well, his reflux is under very good control with his current plan.  Assuming he does well with the plan noted above I will see him back in this clinic in 6 months or earlier if there is a problem.    Schuyler Custard, MD Allergy / Immunology [facility]

## 2023-07-21 NOTE — Patient Instructions (Signed)
    1. Continue to Treat and prevent inflammation:   A.  Flonase  1-2 sprays each nostril 1 time per day  B.  montelukast  10 mg one tablet 1 time per day  C.  Symbicort  160 - 2 inhalations 1-2 times per day  D.  Immunotherapy  2. If needed:   A. SYMBICORT  160 - 2 inhalations every 6 hours if needed (replaces albuterol )  B. Antihistamine - Cetirizine   C. EpiPen , Benadryl , M.D./ER for allergic reaction  E. Omeprazole  20 mg 1-2 times a day  3. Influenza = Tamiflu. Covid = Paxlovid  4. Return to clinic in 6 months or earlier if problem

## 2023-07-22 ENCOUNTER — Encounter: Payer: Self-pay | Admitting: Allergy and Immunology

## 2023-08-10 ENCOUNTER — Ambulatory Visit (INDEPENDENT_AMBULATORY_CARE_PROVIDER_SITE_OTHER)

## 2023-08-10 DIAGNOSIS — J309 Allergic rhinitis, unspecified: Secondary | ICD-10-CM | POA: Diagnosis not present

## 2023-09-08 ENCOUNTER — Ambulatory Visit (INDEPENDENT_AMBULATORY_CARE_PROVIDER_SITE_OTHER)

## 2023-09-08 DIAGNOSIS — J309 Allergic rhinitis, unspecified: Secondary | ICD-10-CM

## 2023-10-09 ENCOUNTER — Ambulatory Visit (INDEPENDENT_AMBULATORY_CARE_PROVIDER_SITE_OTHER): Admitting: *Deleted

## 2023-10-09 DIAGNOSIS — J309 Allergic rhinitis, unspecified: Secondary | ICD-10-CM | POA: Diagnosis not present

## 2023-11-03 ENCOUNTER — Ambulatory Visit (INDEPENDENT_AMBULATORY_CARE_PROVIDER_SITE_OTHER)

## 2023-11-03 DIAGNOSIS — J309 Allergic rhinitis, unspecified: Secondary | ICD-10-CM

## 2023-11-05 DIAGNOSIS — J3081 Allergic rhinitis due to animal (cat) (dog) hair and dander: Secondary | ICD-10-CM | POA: Diagnosis not present

## 2023-11-05 DIAGNOSIS — J302 Other seasonal allergic rhinitis: Secondary | ICD-10-CM | POA: Diagnosis not present

## 2023-11-05 NOTE — Progress Notes (Signed)
 VIALS MADE 11-05-23

## 2023-11-06 DIAGNOSIS — J3089 Other allergic rhinitis: Secondary | ICD-10-CM | POA: Diagnosis not present

## 2023-11-06 DIAGNOSIS — J301 Allergic rhinitis due to pollen: Secondary | ICD-10-CM | POA: Diagnosis not present

## 2023-12-15 ENCOUNTER — Ambulatory Visit (INDEPENDENT_AMBULATORY_CARE_PROVIDER_SITE_OTHER)

## 2023-12-15 DIAGNOSIS — J309 Allergic rhinitis, unspecified: Secondary | ICD-10-CM

## 2024-01-11 ENCOUNTER — Encounter: Payer: Self-pay | Admitting: Family Medicine

## 2024-01-11 ENCOUNTER — Encounter

## 2024-01-11 ENCOUNTER — Ambulatory Visit: Admitting: Family Medicine

## 2024-01-11 ENCOUNTER — Other Ambulatory Visit: Payer: Self-pay

## 2024-01-11 VITALS — BP 120/60 | HR 93 | Temp 98.7°F

## 2024-01-11 DIAGNOSIS — Z91018 Allergy to other foods: Secondary | ICD-10-CM | POA: Insufficient documentation

## 2024-01-11 DIAGNOSIS — K219 Gastro-esophageal reflux disease without esophagitis: Secondary | ICD-10-CM | POA: Diagnosis not present

## 2024-01-11 DIAGNOSIS — J4540 Moderate persistent asthma, uncomplicated: Secondary | ICD-10-CM

## 2024-01-11 DIAGNOSIS — J45909 Unspecified asthma, uncomplicated: Secondary | ICD-10-CM | POA: Insufficient documentation

## 2024-01-11 DIAGNOSIS — J302 Other seasonal allergic rhinitis: Secondary | ICD-10-CM | POA: Insufficient documentation

## 2024-01-11 DIAGNOSIS — J3089 Other allergic rhinitis: Secondary | ICD-10-CM | POA: Diagnosis not present

## 2024-01-11 MED ORDER — FLUTICASONE PROPIONATE 50 MCG/ACT NA SUSP: 2.0000 | Freq: Every morning | NASAL | 1 refills | Status: AC

## 2024-01-11 MED ORDER — BUDESONIDE-FORMOTEROL FUMARATE 160-4.5 MCG/ACT IN AERO: 2.0000 | INHALATION_SPRAY | Freq: Two times a day (BID) | RESPIRATORY_TRACT | 1 refills | Status: AC

## 2024-01-11 MED ORDER — MONTELUKAST SODIUM 10 MG PO TABS: 10.0000 mg | ORAL_TABLET | Freq: Every evening | ORAL | 1 refills | Status: AC

## 2024-01-11 MED ORDER — OMEPRAZOLE 20 MG PO CPDR: 20.0000 mg | DELAYED_RELEASE_CAPSULE | Freq: Two times a day (BID) | ORAL | 1 refills | Status: AC

## 2024-01-11 MED ORDER — ALBUTEROL SULFATE (2.5 MG/3ML) 0.083% IN NEBU: 2.5000 mg | INHALATION_SOLUTION | Freq: Four times a day (QID) | RESPIRATORY_TRACT | 1 refills | Status: AC | PRN

## 2024-01-11 MED ORDER — CETIRIZINE HCL 10 MG PO TABS: 10.0000 mg | ORAL_TABLET | Freq: Every day | ORAL | 1 refills | Status: AC | PRN

## 2024-01-11 NOTE — Progress Notes (Signed)
 [address] [place] [address] Dept: [phone]  FOLLOW UP NOTE  Patient ID: Harry Simmons, male    DOB: Aug 22, 1960  Age: 63 y.o. MRN: [id number] Date of Office Visit: 01/11/2024  Assessment  Chief Complaint: Follow-up (Allergies/Asthma/No concerns)  HPI Harry Simmons is a 63 year old male who presents to the clinic for follow-up visit.  He was last seen in this clinic on 07/21/2023 by Dr. Kozlow for evaluation of asthma, allergic rhinitis on allergen immunotherapy, reflux, and food allergy to shellfish.   Discussed the use of AI scribe software for clinical note transcription with the patient, who gave verbal consent to proceed.  History of Present Illness Harry Simmons is a 63 year old male with asthma and allergies who presents for re-evaluation of eye watering and respiratory symptoms.  He experiences significant eye watering, particularly when exposed to sunlight. He attributes this to dry eyes and uses eye drops provided by the [facility] for management. His eyes produce a faint substance, leading to excessive tearing. His allergies are generally well-controlled. No runny nose, stuffiness, sneezing, or throat drainage. He uses Flonase  nasal spray a couple of times a week and alternates between Claritin  and Zyrtec , with Zyrtec  being more effective. He began allergen immunotherapy directed toward dust mite, weed, grass pollen, tree pollen, cat, and dog on 11/05/2015. He continues allergen immunotherapy with no large or local reactions.  He reports a significant decrease in his symptoms of allergic rhinitis while continuing on allergen immunotherapy.  He reports that when he misses a dose of allergen immunotherapy he begins to experience symptoms of allergic rhinitis including rhinorrhea, nasal congestion, and sneezing.  He manages asthma with Symbicort  160-2 puffs twice daily and uses a rescue inhaler only when overexerting himself, such as during exercise, which he has not done  recently. He notes increased albuterol  use during spring and early fall due to environmental triggers.  He denies any issues with reflux or heartburn, attributing this to dietary habits and sleeping positions. He continues to take omeprazole .  He avoids lobster with no accidental ingestion or EpiPen  use since his last visit to this clinic.  He reports that he can consume other shellfish without adverse reaction.  EpiPen  set is up-to-date.  No food allergy testing is available for review.  His current medications are listed in the chart.   Drug Allergies:  Allergies  Allergen Reactions   Codeine Hives and Rash    Tolerates Dilaudid .   Lobster [Shellfish Allergy] Shortness Of Breath    Patient stated not all shellfish just lobster   Dibucaine Rash    Physical Exam: BP 120/60   Pulse 93   Temp 98.7 F (37.1 C)   SpO2 97%    Physical Exam Vitals reviewed.  Constitutional:      Appearance: Normal appearance.  HENT:     Head: Normocephalic and atraumatic.     Right Ear: Tympanic membrane normal.     Left Ear: Tympanic membrane normal.     Nose:     Comments: Bilateral nares slightly erythematous with thin clear nasal drainage noted.  Pharynx normal.  Ears normal.  Eyes normal.    Mouth/Throat:     Pharynx: Oropharynx is clear.  Eyes:     Conjunctiva/sclera: Conjunctivae normal.  Cardiovascular:     Rate and Rhythm: Normal rate and regular rhythm.     Heart sounds: Normal heart sounds. No murmur heard. Pulmonary:     Effort: Pulmonary effort is normal.  Breath sounds: Normal breath sounds.     Comments: Lungs clear to auscultation Musculoskeletal:        General: Normal range of motion.     Cervical back: Normal range of motion and neck supple.  Skin:    General: Skin is warm and dry.  Neurological:     Mental Status: He is alert and oriented to person, place, and time.  Psychiatric:        Mood and Affect: Mood normal.        Behavior: Behavior normal.         Thought Content: Thought content normal.        Judgment: Judgment normal.     Diagnostics: FVC 2.70 which is 80% of predicted value, FEV1 2.06 which is 78% of predicted values.  Spirometry indicates normal ventilatory function.  Assessment and Plan: 1. Asthma, moderate persistent, well-controlled   2. Seasonal and perennial allergic rhinitis   3. Gastroesophageal reflux disease, unspecified whether esophagitis present   4. Food allergy     Meds ordered this encounter  Medications   albuterol  (PROVENTIL ) (2.5 MG/3ML) 0.083% nebulizer solution    Sig: Take 3 mLs (2.5 mg total) by nebulization every 6 (six) hours as needed for wheezing or shortness of breath.    Dispense:  150 mL    Refill:  1   budesonide -formoterol  (SYMBICORT ) 160-4.5 MCG/ACT inhaler    Sig: Inhale 2 puffs into the lungs 2 (two) times daily.    Dispense:  30.6 g    Refill:  1   cetirizine  (ZYRTEC ) 10 MG tablet    Sig: Take 1 tablet (10 mg total) by mouth daily as needed for allergies (Can take an extra dose during flare ups.).    Dispense:  180 tablet    Refill:  1   montelukast  (SINGULAIR ) 10 MG tablet    Sig: Take 1 tablet (10 mg total) by mouth at bedtime.    Dispense:  90 tablet    Refill:  1   fluticasone  (FLONASE ) 50 MCG/ACT nasal spray    Sig: Place 2 sprays into both nostrils every morning. SHAKE LIQUID AND USE 1 TO 2 SPRAYS IN EACH NOSTRIL EVERY DAY    Dispense:  48 g    Refill:  1   omeprazole  (PRILOSEC ) 20 MG capsule    Sig: Take 1 capsule (20 mg total) by mouth 2 (two) times daily before a meal. Take one capsule twice daily    Dispense:  180 capsule    Refill:  1    Patient Instructions     1. Continue to Treat and prevent inflammation:   A.  Flonase  1-2 sprays each nostril 1 time per day  B.  montelukast  10 mg one tablet 1 time per day  C.  Symbicort  160 - 2 inhalations 1-2 times per day  D.  Immunotherapy  2. If needed:   A. SYMBICORT  160 - 2 inhalations every 6 hours if needed  (replaces albuterol )  B. Antihistamine - Cetirizine   C. EpiPen , Benadryl , M.D./ER for allergic reaction  E. Omeprazole  20 mg 1-2 times a day  3. Influenza = Tamiflu. Covid = Paxlovid  4. Return to clinic in 6 months or earlier if problem    Return in about 6 months (around 07/10/2024), or if symptoms worsen or fail to improve.    Thank you for the opportunity to care for this patient.  Please do not hesitate to contact me with questions.  Arlean Mutter, FNP [facility] 

## 2024-01-11 NOTE — Patient Instructions (Signed)
    1. Continue to Treat and prevent inflammation:   A.  Flonase  1-2 sprays each nostril 1 time per day  B.  montelukast  10 mg one tablet 1 time per day  C.  Symbicort  160 - 2 inhalations 1-2 times per day  D.  Immunotherapy  2. If needed:   A. SYMBICORT  160 - 2 inhalations every 6 hours if needed (replaces albuterol )  B. Antihistamine - Cetirizine   C. EpiPen , Benadryl , M.D./ER for allergic reaction  E. Omeprazole  20 mg 1-2 times a day  3. Influenza = Tamiflu. Covid = Paxlovid  4. Return to clinic in 6 months or earlier if problem

## 2024-02-12 ENCOUNTER — Ambulatory Visit

## 2024-02-12 DIAGNOSIS — J4540 Moderate persistent asthma, uncomplicated: Secondary | ICD-10-CM | POA: Diagnosis not present

## 2024-02-15 ENCOUNTER — Ambulatory Visit

## 2024-02-15 DIAGNOSIS — J309 Allergic rhinitis, unspecified: Secondary | ICD-10-CM

## 2024-02-22 ENCOUNTER — Ambulatory Visit

## 2024-02-22 DIAGNOSIS — J309 Allergic rhinitis, unspecified: Secondary | ICD-10-CM

## 2024-03-16 ENCOUNTER — Ambulatory Visit

## 2024-03-16 DIAGNOSIS — J302 Other seasonal allergic rhinitis: Secondary | ICD-10-CM

## 2024-04-07 ENCOUNTER — Ambulatory Visit

## 2024-04-07 DIAGNOSIS — J302 Other seasonal allergic rhinitis: Secondary | ICD-10-CM

## 2024-07-11 ENCOUNTER — Ambulatory Visit: Admitting: Family Medicine
